# Patient Record
Sex: Male | Born: 1975 | Hispanic: Yes | Marital: Single | Smoking: Never smoker
Health system: Southern US, Community
[De-identification: ages and names within clinical notes are randomized; demographics above are authoritative.]

## PROBLEM LIST (undated history)

## (undated) HISTORY — PX: FACIAL FRACTURE SURGERY: SHX1570

---

## 2014-02-20 ENCOUNTER — Emergency Department: Payer: Self-pay | Admitting: Emergency Medicine

## 2020-12-07 ENCOUNTER — Emergency Department
Admission: EM | Admit: 2020-12-07 | Discharge: 2020-12-07 | Disposition: A | Discharge status: Home / Self Care | Payer: HRSA Program | Attending: Emergency Medicine | Admitting: Emergency Medicine

## 2020-12-07 ENCOUNTER — Other Ambulatory Visit: Payer: Self-pay

## 2020-12-07 DIAGNOSIS — U071 COVID-19: Secondary | ICD-10-CM | POA: Insufficient documentation

## 2020-12-07 DIAGNOSIS — M545 Low back pain, unspecified: Secondary | ICD-10-CM | POA: Diagnosis not present

## 2020-12-07 DIAGNOSIS — R197 Diarrhea, unspecified: Secondary | ICD-10-CM | POA: Diagnosis present

## 2020-12-07 DIAGNOSIS — E860 Dehydration: Secondary | ICD-10-CM | POA: Insufficient documentation

## 2020-12-07 DIAGNOSIS — A084 Viral intestinal infection, unspecified: Secondary | ICD-10-CM | POA: Diagnosis not present

## 2020-12-07 LAB — COMPREHENSIVE METABOLIC PANEL
ALT: 37 U/L (ref 0–44)
AST: 49 U/L — ABNORMAL HIGH (ref 15–41)
Albumin: 3 g/dL — ABNORMAL LOW (ref 3.5–5.0)
Alkaline Phosphatase: 81 U/L (ref 38–126)
Anion gap: 15 (ref 5–15)
BUN: 20 mg/dL (ref 6–20)
CO2: 21 mmol/L — ABNORMAL LOW (ref 22–32)
Calcium: 8.6 mg/dL — ABNORMAL LOW (ref 8.9–10.3)
Chloride: 101 mmol/L (ref 98–111)
Creatinine, Ser: 1.85 mg/dL — ABNORMAL HIGH (ref 0.61–1.24)
GFR, Estimated: 46 mL/min — ABNORMAL LOW (ref >60)
Glucose, Bld: 130 mg/dL — ABNORMAL HIGH (ref 70–99)
Potassium: 3.5 mmol/L (ref 3.5–5.1)
Sodium: 137 mmol/L (ref 135–145)
Total Bilirubin: 1.7 mg/dL — ABNORMAL HIGH (ref 0.3–1.2)
Total Protein: 7.6 g/dL (ref 6.5–8.1)

## 2020-12-07 LAB — CBC
HCT: 52.4 % — ABNORMAL HIGH (ref 39.0–52.0)
Hemoglobin: 18.6 g/dL — ABNORMAL HIGH (ref 13.0–17.0)
MCH: 31.6 pg (ref 26.0–34.0)
MCHC: 35.5 g/dL (ref 30.0–36.0)
MCV: 89 fL (ref 80.0–100.0)
Platelets: 260 10*3/uL (ref 150–400)
RBC: 5.89 MIL/uL — ABNORMAL HIGH (ref 4.22–5.81)
RDW: 12.6 % (ref 11.5–15.5)
WBC: 9.2 10*3/uL (ref 4.0–10.5)
nRBC: 0 % (ref 0.0–0.2)

## 2020-12-07 LAB — URINALYSIS, COMPLETE (UACMP) WITH MICROSCOPIC
Bilirubin Urine: NEGATIVE
Glucose, UA: NEGATIVE mg/dL
Ketones, ur: 20 mg/dL — AB
Leukocytes,Ua: NEGATIVE
Nitrite: NEGATIVE
Protein, ur: >=300 mg/dL — AB
Specific Gravity, Urine: 1.027 (ref 1.005–1.030)
pH: 5 (ref 5.0–8.0)

## 2020-12-07 LAB — POC SARS CORONAVIRUS 2 AG -  ED: SARS Coronavirus 2 Ag: NEGATIVE

## 2020-12-07 LAB — LACTIC ACID, PLASMA: Lactic Acid, Venous: 1.8 mmol/L (ref 0.5–1.9)

## 2020-12-07 LAB — LIPASE, BLOOD: Lipase: 41 U/L (ref 11–51)

## 2020-12-07 MED ORDER — PANTOPRAZOLE SODIUM 40 MG IV SOLR
40.0000 mg | Freq: Once | INTRAVENOUS | Status: AC
  Administered 2020-12-07: 40 mg via INTRAVENOUS
  Filled 2020-12-07: qty 40

## 2020-12-07 MED ORDER — OXYCODONE-ACETAMINOPHEN 5-325 MG PO TABS: 1.0000 | ORAL_TABLET | Freq: Once | ORAL | Status: DC

## 2020-12-07 MED ORDER — DEXTROSE 5 % AND 0.9 % NACL IV BOLUS
1000.0000 mL | Freq: Once | INTRAVENOUS | Status: DC
  Filled 2020-12-07: qty 1000

## 2020-12-07 MED ORDER — ONDANSETRON 4 MG PO TBDP: 4.0000 mg | ORAL_TABLET | Freq: Once | ORAL | Status: DC | PRN

## 2020-12-07 MED ORDER — FENTANYL CITRATE (PF) 100 MCG/2ML IJ SOLN
50.0000 ug | INTRAMUSCULAR | Status: AC | PRN
  Administered 2020-12-07 (×2): 50 ug via INTRAVENOUS
  Filled 2020-12-07 (×2): qty 2

## 2020-12-07 MED ORDER — ONDANSETRON HCL 4 MG/2ML IJ SOLN
4.0000 mg | Freq: Once | INTRAMUSCULAR | Status: AC
  Administered 2020-12-07: 4 mg via INTRAVENOUS
  Filled 2020-12-07: qty 2

## 2020-12-07 MED ORDER — FAMOTIDINE 20 MG PO TABS: 20.0000 mg | ORAL_TABLET | Freq: Two times a day (BID) | ORAL | 0 refills | Status: AC

## 2020-12-07 MED ORDER — DEXTROSE 5 % IN LACTATED RINGERS IV BOLUS
1000.0000 mL | Freq: Once | INTRAVENOUS | Status: AC
  Administered 2020-12-07: 1000 mL via INTRAVENOUS
  Filled 2020-12-07: qty 1000

## 2020-12-07 MED ORDER — ONDANSETRON 4 MG PO TBDP: 4.0000 mg | ORAL_TABLET | Freq: Three times a day (TID) | ORAL | 0 refills | Status: AC | PRN

## 2020-12-07 MED ORDER — NAPROXEN 500 MG PO TABS: 500.0000 mg | ORAL_TABLET | Freq: Two times a day (BID) | ORAL | 0 refills | Status: DC

## 2020-12-07 NOTE — ED Notes (Signed)
Provided with drink and crackers

## 2020-12-07 NOTE — ED Triage Notes (Signed)
Pt c/o severe lower back pain with N/V/D for the past week. Denies fever,

## 2020-12-07 NOTE — ED Provider Notes (Signed)
[facility] Emergency Department Provider Note  ____________________________________________  Time seen: Approximately 3:45 PM  I have reviewed the triage vital signs and the nursing notes.   HISTORY  Chief Complaint Emesis, Back Pain, and Diarrhea    HPI Luis Dyer is a 45 y.o. male with no significant past medical history who comes ED complaining of nausea vomiting diarrhea for the past week associated with gradual onset of bilateral low back pain which hurts with movement, nonradiating.  No alleviating factors.  Moderate intensity.  Feels achy.  No black or bloody stool, no bloody vomit.  No dizziness or syncope.  No body aches or fever.      History reviewed. No pertinent past medical history.   There are no problems to display for this patient.    Past Surgical History:  Procedure Laterality Date  . FACIAL FRACTURE SURGERY       Prior to Admission medications   Medication Sig Start Date End Date Taking? Authorizing Provider  famotidine (PEPCID) 20 MG tablet Take 1 tablet (20 mg total) by mouth 2 (two) times daily. 12/07/20  Yes Sharman Cheek, MD  naproxen (NAPROSYN) 500 MG tablet Take 1 tablet (500 mg total) by mouth 2 (two) times daily with a meal. 12/07/20  Yes Sharman Cheek, MD  ondansetron (ZOFRAN ODT) 4 MG disintegrating tablet Take 1 tablet (4 mg total) by mouth every 8 (eight) hours as needed for nausea or vomiting. 12/07/20  Yes Sharman Cheek, MD     Allergies Patient has no known allergies.   No family history on file.  Social History Social History   Tobacco Use  . Smoking status: Never Smoker  . Smokeless tobacco: Never Used  Substance Use Topics  . Alcohol use: Not Currently  . Drug use: Not Currently    Review of Systems  Constitutional:   No fever or chills.  ENT:   No sore throat. No rhinorrhea. Cardiovascular:   No chest pain or syncope. Respiratory:   No dyspnea or cough. Gastrointestinal:    Negative for abdominal pain, positive vomiting and diarrhea.  Musculoskeletal:   Positive low back pain All other systems reviewed and are negative except as documented above in ROS and HPI.  ____________________________________________   PHYSICAL EXAM:  VITAL SIGNS: ED Triage Vitals  Enc Vitals Group     BP 12/07/20 0945 109/75     Pulse Rate 12/07/20 0945 (!) 144     Resp 12/07/20 0945 18     Temp 12/07/20 0945 98.1 F (36.7 C)     Temp src --      SpO2 12/07/20 0945 95 %     Weight 12/07/20 0950 188 lb (85.3 kg)     Height 12/07/20 0950 6' (1.829 m)     Head Circumference --      Peak Flow --      Pain Score 12/07/20 0950 10     Pain Loc --      Pain Edu? --      Excl. in GC? --     Vital signs reviewed, nursing assessments reviewed.   Constitutional:   Alert and oriented. Non-toxic appearance. Eyes:   Conjunctivae are normal. EOMI. PERRL. ENT      Head:   Normocephalic and atraumatic.      Nose:   Wearing a mask.      Mouth/Throat:   Wearing a mask.      Neck:   No meningismus. Full ROM. Hematological/Lymphatic/Immunilogical:  No cervical lymphadenopathy. Cardiovascular:   Tachycardia heart rate 120. Symmetric bilateral radial and DP pulses.  No murmurs. Cap refill less than 2 seconds. Respiratory:   Normal respiratory effort without tachypnea/retractions. Breath sounds are clear and equal bilaterally. No wheezes/rales/rhonchi. Gastrointestinal:   Soft and nontender. Non distended. There is no CVA tenderness.  No rebound, rigidity, or guarding.  Musculoskeletal:   Normal range of motion in all extremities. No joint effusions.  No lower extremity tenderness.  No edema.  No midline spinal tenderness.  There is mild muscular tenderness in the lower back bilaterally Neurologic:   Normal speech and language.  Motor grossly intact. No acute focal neurologic deficits are appreciated.  Skin:    Skin is warm, dry and intact. No rash noted.  No petechiae, purpura, or  bullae.  ____________________________________________    LABS (pertinent positives/negatives) (all labs ordered are listed, but only abnormal results are displayed) Labs Reviewed  COMPREHENSIVE METABOLIC PANEL - Abnormal; Notable for the following components:      Result Value   CO2 21 (*)    Glucose, Bld 130 (*)    Creatinine, Ser 1.85 (*)    Calcium 8.6 (*)    Albumin 3.0 (*)    AST 49 (*)    Total Bilirubin 1.7 (*)    GFR, Estimated 46 (*)    All other components within normal limits  CBC - Abnormal; Notable for the following components:   RBC 5.89 (*)    Hemoglobin 18.6 (*)    HCT 52.4 (*)    All other components within normal limits  URINALYSIS, COMPLETE (UACMP) WITH MICROSCOPIC - Abnormal; Notable for the following components:   Color, Urine AMBER (*)    APPearance CLEAR (*)    Hgb urine dipstick SMALL (*)    Ketones, ur 20 (*)    Protein, ur >=300 (*)    Bacteria, UA RARE (*)    All other components within normal limits  SARS CORONAVIRUS 2 (TAT 6-24 HRS)  LIPASE, BLOOD  LACTIC ACID, PLASMA  POC SARS CORONAVIRUS 2 AG -  ED   ____________________________________________   EKG  Interpreted by me Sinus tachycardia rate 142.  Normal axis and intervals.  Normal QRS ST segments and T waves.  No ischemic changes  ____________________________________________    RADIOLOGY  No results found.  ____________________________________________   PROCEDURES Procedures  ____________________________________________  DIFFERENTIAL DIAGNOSIS   COVID, influenza, viral gastroenteritis, dehydration, electrolyte abnormality  CLINICAL IMPRESSION / ASSESSMENT AND PLAN / ED COURSE  Medications ordered in the ED: Medications  fentaNYL (SUBLIMAZE) injection 50 mcg (50 mcg Intravenous Given 12/07/20 1429)  ondansetron (ZOFRAN) injection 4 mg (4 mg Intravenous Given 12/07/20 1013)  pantoprazole (PROTONIX) injection 40 mg (40 mg Intravenous Given 12/07/20 1309)  dextrose 5%  lactated ringers bolus 1,000 mL (0 mLs Intravenous Stopped 12/07/20 1423)    Pertinent labs & imaging results that were available during my care of the patient were reviewed by me and considered in my medical decision making (see chart for details).  Javonni A Dennis Bast was evaluated in Emergency Department on 12/07/2020 for the symptoms described in the history of present illness. He was evaluated in the context of the global COVID-19 pandemic, which necessitated consideration that the patient might be at risk for infection with the SARS-CoV-2 virus that causes COVID-19. Institutional protocols and algorithms that pertain to the evaluation of patients at risk for COVID-19 are in a state of rapid change based on information released by regulatory bodies  including the CDC and federal and state organizations. These policies and algorithms were followed during the patient's care in the ED.   Patient presents with nausea vomiting diarrhea, evidence of dehydration.  Back pain appears to be musculoskeletal.  Exam is benign and reassuring other than tachycardia which I think is due to dehydration.    Considering the patient's symptoms, medical history, and physical examination today, I have low suspicion for cholecystitis or biliary pathology, pancreatitis, perforation or bowel obstruction, hernia, intra-abdominal abscess, AAA or dissection, volvulus or intussusception, mesenteric ischemia, or appendicitis.  Labs show hemoconcentration, some ketones in the urine consistent with dehydration.  Patient given IV fluids, Zofran and Protonix and feels much better.  States his symptoms have resolved, he is tolerating p.o. and stable for discharge home.  We will continue to treat supportively with Zofran and Pepcid.      ____________________________________________   FINAL CLINICAL IMPRESSION(S) / ED DIAGNOSES    Final diagnoses:  Viral gastroenteritis  Dehydration     ED Discharge Orders         Ordered     famotidine (PEPCID) 20 MG tablet  2 times daily        12/07/20 1544    ondansetron (ZOFRAN ODT) 4 MG disintegrating tablet  Every 8 hours PRN        12/07/20 1544    naproxen (NAPROSYN) 500 MG tablet  2 times daily with meals        12/07/20 1544          Portions of this note were generated with dragon dictation software. Dictation errors may occur despite best attempts at proofreading.   Sharman Cheek, MD 12/07/20 [phone]

## 2020-12-08 LAB — SARS CORONAVIRUS 2 (TAT 6-24 HRS): SARS Coronavirus 2: POSITIVE — AB

## 2020-12-13 ENCOUNTER — Other Ambulatory Visit: Payer: Self-pay

## 2020-12-13 ENCOUNTER — Emergency Department: Payer: HRSA Program

## 2020-12-13 ENCOUNTER — Inpatient Hospital Stay
Admission: EM | Admit: 2020-12-13 | Discharge: 2020-12-27 | DRG: 177 | Disposition: A | Discharge status: Home Health | Payer: HRSA Program | Attending: Internal Medicine | Admitting: Internal Medicine

## 2020-12-13 DIAGNOSIS — R7301 Impaired fasting glucose: Secondary | ICD-10-CM | POA: Diagnosis not present

## 2020-12-13 DIAGNOSIS — N179 Acute kidney failure, unspecified: Secondary | ICD-10-CM | POA: Diagnosis present

## 2020-12-13 DIAGNOSIS — I2609 Other pulmonary embolism with acute cor pulmonale: Secondary | ICD-10-CM | POA: Diagnosis not present

## 2020-12-13 DIAGNOSIS — T380X5A Adverse effect of glucocorticoids and synthetic analogues, initial encounter: Secondary | ICD-10-CM | POA: Diagnosis not present

## 2020-12-13 DIAGNOSIS — U071 COVID-19: Principal | ICD-10-CM | POA: Diagnosis present

## 2020-12-13 DIAGNOSIS — J1282 Pneumonia due to coronavirus disease 2019: Secondary | ICD-10-CM | POA: Diagnosis present

## 2020-12-13 DIAGNOSIS — Z79899 Other long term (current) drug therapy: Secondary | ICD-10-CM | POA: Diagnosis not present

## 2020-12-13 DIAGNOSIS — I2694 Multiple subsegmental pulmonary emboli without acute cor pulmonale: Secondary | ICD-10-CM

## 2020-12-13 DIAGNOSIS — I2699 Other pulmonary embolism without acute cor pulmonale: Secondary | ICD-10-CM

## 2020-12-13 DIAGNOSIS — N182 Chronic kidney disease, stage 2 (mild): Secondary | ICD-10-CM | POA: Diagnosis present

## 2020-12-13 DIAGNOSIS — J9601 Acute respiratory failure with hypoxia: Secondary | ICD-10-CM | POA: Diagnosis present

## 2020-12-13 DIAGNOSIS — R7989 Other specified abnormal findings of blood chemistry: Secondary | ICD-10-CM

## 2020-12-13 DIAGNOSIS — R531 Weakness: Secondary | ICD-10-CM

## 2020-12-13 DIAGNOSIS — N189 Chronic kidney disease, unspecified: Secondary | ICD-10-CM

## 2020-12-13 LAB — CBC
HCT: 43.7 % (ref 39.0–52.0)
Hemoglobin: 15.1 g/dL (ref 13.0–17.0)
MCH: 31.1 pg (ref 26.0–34.0)
MCHC: 34.6 g/dL (ref 30.0–36.0)
MCV: 89.9 fL (ref 80.0–100.0)
Platelets: 350 10*3/uL (ref 150–400)
RBC: 4.86 MIL/uL (ref 4.22–5.81)
RDW: 13.7 % (ref 11.5–15.5)
WBC: 10.1 10*3/uL (ref 4.0–10.5)
nRBC: 0 % (ref 0.0–0.2)

## 2020-12-13 LAB — BASIC METABOLIC PANEL
Anion gap: 13 (ref 5–15)
BUN: 20 mg/dL (ref 6–20)
CO2: 22 mmol/L (ref 22–32)
Calcium: 8.1 mg/dL — ABNORMAL LOW (ref 8.9–10.3)
Chloride: 103 mmol/L (ref 98–111)
Creatinine, Ser: 1.85 mg/dL — ABNORMAL HIGH (ref 0.61–1.24)
GFR, Estimated: 46 mL/min — ABNORMAL LOW (ref >60)
Glucose, Bld: 107 mg/dL — ABNORMAL HIGH (ref 70–99)
Potassium: 3.6 mmol/L (ref 3.5–5.1)
Sodium: 138 mmol/L (ref 135–145)

## 2020-12-13 LAB — TROPONIN I (HIGH SENSITIVITY)
Troponin I (High Sensitivity): 6 ng/L (ref <18)
Troponin I (High Sensitivity): 6 ng/L (ref <18)

## 2020-12-13 MED ORDER — SODIUM CHLORIDE 0.9 % IV SOLN
100.0000 mg | Freq: Every day | INTRAVENOUS | Status: AC
  Administered 2020-12-14 – 2020-12-17 (×4): 100 mg via INTRAVENOUS
  Filled 2020-12-13 (×3): qty 20
  Filled 2020-12-13: qty 100

## 2020-12-13 MED ORDER — LACTATED RINGERS IV BOLUS
1000.0000 mL | Freq: Once | INTRAVENOUS | Status: AC
  Administered 2020-12-13: 1000 mL via INTRAVENOUS

## 2020-12-13 MED ORDER — DEXAMETHASONE SODIUM PHOSPHATE 10 MG/ML IJ SOLN
10.0000 mg | Freq: Once | INTRAMUSCULAR | Status: AC
  Administered 2020-12-13: 10 mg via INTRAVENOUS
  Filled 2020-12-13: qty 1

## 2020-12-13 MED ORDER — SODIUM CHLORIDE 0.9 % IV SOLN
200.0000 mg | Freq: Once | INTRAVENOUS | Status: AC
  Administered 2020-12-13: 200 mg via INTRAVENOUS
  Filled 2020-12-13: qty 200

## 2020-12-13 NOTE — H&P (Signed)
History and Physical        Hospital Admission Note Date: 12/13/2020  Patient name: Luis Dyer Medical record number: [id number] Date of birth: 11-04-1976 Age: 45 y.o. Gender: male  PCP: Patient, Dyer Pcp Per    Patient coming from: Home   I have reviewed all records in the [facility].    Chief Complaint:  SOB   HPI: Luis Dyer is a 45 y.o. male with Dyer significant PMH who presents to the ER with SOB and associated chest pain for the past 2-3 days. Reports he was seen in the ED a week ago. Treated for dehydration and gastroenteritis. Apparently COVID test at that time was positive but patient reports he was not informed.   Reports aching chest pain present with coughing. Not at rest or with inspiration. SOB is fairly constant and worse with exertion. Endorses chills/rigor. Cannot recall if he was vaccinated for COVID.    ED work-up/course:   Patient presents with shortness of breath and hypoxia, tachycardia in the setting of recent COVID-19 diagnosis.  Chest x-ray viewed and interpreted by me, shows multifocal infiltrates consistent with COVID-pneumonia.  Radiology report agrees.  Doubt PE, ACS, dissection.  Doubt bacterial infection or sepsis.  Will give IV fluids for hydration, Decadron and remdesivir for hypoxic respiratory failure with COVID-pneumonia, plan to admit.  Findings and plan of care discussed with the patient's Sister by phone at his request.  Review of Systems: Positives marked in 'bold' Constitutional: Denies fever, chills, diaphoresis, poor appetite and fatigue.  HEENT: Denies photophobia, eye pain, redness, hearing loss, ear pain, congestion, sore throat, rhinorrhea, sneezing, mouth sores, trouble swallowing, neck pain, neck stiffness and tinnitus.   Respiratory: Denies SOB, DOE, cough, chest tightness,  and wheezing.   Cardiovascular: Denies chest pain, palpitations and leg  swelling.  Gastrointestinal: Denies nausea, vomiting, abdominal pain, diarrhea, constipation, blood in stool and abdominal distention.  Genitourinary: Denies dysuria, urgency, frequency, hematuria, flank pain and difficulty urinating.  Musculoskeletal: Denies myalgias, back pain, joint swelling, arthralgias and gait problem.  Skin: Denies pallor, rash and wound.  Neurological: Denies dizziness, seizures, syncope, weakness, light-headedness, numbness and headaches.  Hematological: Denies adenopathy. Easy bruising, personal or family bleeding history  Psychiatric/Behavioral: Denies suicidal ideation, mood changes, confusion, nervousness, sleep disturbance and agitation  Past Medical History: History reviewed. Dyer pertinent past medical history.  Past Surgical History:  Procedure Laterality Date  . FACIAL FRACTURE SURGERY      Medications: Prior to Admission medications   Medication Sig Start Date End Date Taking? Authorizing Provider  famotidine (PEPCID) 20 MG tablet Take 1 tablet (20 mg total) by mouth 2 (two) times daily. 12/07/20   Sharman Cheek, MD  naproxen (NAPROSYN) 500 MG tablet Take 1 tablet (500 mg total) by mouth 2 (two) times daily with a meal. 12/07/20   Sharman Cheek, MD  ondansetron (ZOFRAN ODT) 4 MG disintegrating tablet Take 1 tablet (4 mg total) by mouth every 8 (eight) hours as needed for nausea or vomiting. 12/07/20   Sharman Cheek, MD    Allergies:  Dyer Known Allergies  Social History:  reports that he has never smoked. He has never  used smokeless tobacco. He reports previous alcohol use. He reports previous drug use.  Family History: Dyer family history on file.  Physical Exam: Blood pressure 109/80, pulse 97, temperature 99.9 F (37.7 C), temperature source Oral, resp. rate (!) 41, height 6' (1.829 m), weight 85.3 kg, SpO2 95 %. General: Alert, awake, oriented x3, non-toxic  Eyes: pink conjunctiva,anicteric sclera, pupils equal and reactive to light and  accomodation, HEENT: normocephalic, atraumatic, oropharynx clear Neck: supple, Dyer masses or lymphadenopathy, Dyer goiter, Dyer bruits, Dyer JVD CVS: Tachycardic rate and rhythm, without murmurs, rubs or gallops. Dyer lower extremity edema Resp : [place] in place. Tachypnea. Increased WOB. Unable to speak in full sentences. Crackles present in all lung fields.  GI : Soft, nontender, nondistended, positive bowel sounds, Dyer masses. Dyer hepatomegaly. Dyer hernia.  Musculoskeletal: Dyer clubbing or cyanosis, positive pedal pulses. Dyer contracture. ROM intact  Neuro: Grossly intact, Dyer focal neurological deficits, strength 5/5 upper and lower extremities bilaterally Psych: alert and oriented x 3, normal mood and affect Skin: Dyer rashes or lesions, warm and dry   LABS on Admission: I have personally reviewed all the labs and imagings below    Basic Metabolic Panel: Recent Labs  Lab 12/07/20 1003 12/13/20 1427  NA 137 138  K 3.5 3.6  CL 101 103  CO2 21* 22  GLUCOSE 130* 107*  BUN 20 20  CREATININE 1.85* 1.85*  CALCIUM 8.6* 8.1*   Liver Function Tests: Recent Labs  Lab 12/07/20 1003  AST 49*  ALT 37  ALKPHOS 81  BILITOT 1.7*  PROT 7.6  ALBUMIN 3.0*   Recent Labs  Lab 12/07/20 1003  LIPASE 41   Dyer results for input(s): AMMONIA in the last 168 hours. CBC: Recent Labs  Lab 12/07/20 1003 12/13/20 1427  WBC 9.2 10.1  HGB 18.6* 15.1  HCT 52.4* 43.7  MCV 89.0 89.9  PLT 260 350   Cardiac Enzymes: Dyer results for input(s): CKTOTAL, CKMB, CKMBINDEX, TROPONINI in the last 168 hours. BNP: Invalid input(s): POCBNP CBG: Dyer results for input(s): GLUCAP in the last 168 hours.  Radiological Exams on Admission:  DG Chest 2 View  Result Date: 12/13/2020 CLINICAL DATA:  Left chest pain and shortness of breath. COVID-19 positive patient. EXAM: CHEST - 2 VIEW COMPARISON:  None. FINDINGS: There is extensive bilateral airspace disease consistent with pneumonia. Dyer pneumothorax or pleural effusion. Heart  size is normal. IMPRESSION: Extensive multifocal pneumonia has an appearance most compatible with COVID-19 infection. Electronically Signed   By: Drusilla Kanner M.D.   On: 12/13/2020 15:07      EKG: Independently reviewed. Sinus tachycardia.    Assessment/Plan Active Problems:   COVID-19 virus infection   Pneumonia due to COVID-19 virus   Elevated serum creatinine  COVID-19 Pneumonia  Patient presenting with SOB found to be hypoxic to 87% on RA. Tachycardic. Tachypnea. COVID-19 positive from 1/7. CXR shows multifocal PNA. Stable O2 on 4L [place]. Decadron and Remdesivir started in ED. IVF given.  -admit to progressive care with telemetry and continuous pulse ox  -continue supplemental O2  -monitor labs: phosphorous, Mg, ferritin, D-dimer, CRP, CBC, CMET -continue Decadron -continue Remdesivir  -antitussives  -ipratropium q6h  -airborne and contact precautions   Chest Pain:  Suspect related to SOB and cough with COVID-19 PNA as is intermittent and only associated with coughing. EKG without acute ST segment changes. Troponin neg x2. CXR with PNA but Dyer other acute findings. Doubt ACS. Dyer signs of VTE.   Elevated Serum Cr  Cr 1.85. Consistent with lab obtained 1/7. Dyer prior comparison. Unclear whether AKI related to acute illness versus undiagnosed CKD.  -monitor   DVT prophylaxis: Lovenox   CODE STATUS: FULL   Consults called: None   Family Communication: Admission, patients condition and plan of care including tests being ordered have been discussed with the patient and patient's sister & brother-in-law via phone who indicates understanding and agree with the plan and Code Status  Admission status:  Inpatient   The medical decision making on this patient was of high complexity and the patient is at high risk for clinical deterioration, therefore this is a level 3 admission.  Severity of Illness:     Moderate  The appropriate patient status for this patient is INPATIENT.  Inpatient status is judged to be reasonable and necessary in order to provide the required intensity of service to ensure the patient's safety. The patient's presenting symptoms, physical exam findings, and initial radiographic and laboratory data in the context of their chronic comorbidities is felt to place them at high risk for further clinical deterioration. Furthermore, it is not anticipated that the patient will be medically stable for discharge from the hospital within 2 midnights of admission. The following factors support the patient status of inpatient.   " The patient's presenting symptoms include SOB, cough, chest pain. " The worrisome physical exam findings include hypoxia, tachycardia, tachypnea, crackles in lungs, SOB when speaking in full sentences. " The initial radiographic and laboratory data are worrisome because of multifocal PNA on CXR, COVID-19+. " The chronic co-morbidities include none .   * I certify that at the point of admission it is my clinical judgment that the patient will require inpatient hospital care spanning beyond 2 midnights from the point of admission due to high intensity of service, high risk for further deterioration and high frequency of surveillance required.*    Time Spent on Admission: 48 minutes      De Hollingshead D.O.  [facility] 12/13/2020, 8:38 PM

## 2020-12-13 NOTE — ED Provider Notes (Signed)
[facility] Emergency Department Provider Note  ____________________________________________  Time seen: Approximately 5:52 PM  I have reviewed the triage vital signs and the nursing notes.   HISTORY  Chief Complaint Chest Pain and Shortness of Breath    HPI Luis Dyer is a 45 y.o. male with no significant past medical history  who comes the ED complaining of left-sided chest pain and shortness of breath that were gradual onset and worsening over the past 3 to 4 days.  He was seen in the ED by myself a week ago for gastroenteritis and dehydration.  He was treated with supportive care and hydration, felt much better his symptoms essentially resolved and he was able to be discharged home.  Follow-up PCR test obtained at that time was positive for COVID, and since then he has had progressive symptoms.  Chest pain is aching, moderate intensity, intermittent, only present with coughing, not present with breathing.  Nonradiating.  Shortness of breath is constant, moderate intensity, worse with walking, no alleviating factors.  No history of DVT or PE.  Found to have room air oxygen saturation of 87%, requiring 4 L nasal cannula.   History reviewed. No pertinent past medical history.   There are no problems to display for this patient.    Past Surgical History:  Procedure Laterality Date  . FACIAL FRACTURE SURGERY       Prior to Admission medications   Medication Sig Start Date End Date Taking? Authorizing Provider  famotidine (PEPCID) 20 MG tablet Take 1 tablet (20 mg total) by mouth 2 (two) times daily. 12/07/20   Sharman Cheek, MD  naproxen (NAPROSYN) 500 MG tablet Take 1 tablet (500 mg total) by mouth 2 (two) times daily with a meal. 12/07/20   Sharman Cheek, MD  ondansetron (ZOFRAN ODT) 4 MG disintegrating tablet Take 1 tablet (4 mg total) by mouth every 8 (eight) hours as needed for nausea or vomiting. 12/07/20   Sharman Cheek, MD      Allergies Patient has no known allergies.   No family history on file.  Social History Social History   Tobacco Use  . Smoking status: Never Smoker  . Smokeless tobacco: Never Used  Substance Use Topics  . Alcohol use: Not Currently  . Drug use: Not Currently    Review of Systems  Constitutional:   No fever positive chills.  ENT:   No sore throat. No rhinorrhea. Cardiovascular:   Positive chest discomfort with cough, no syncope. Respiratory: Positive shortness of breath and nonproductive cough. Gastrointestinal:   Negative for abdominal pain, positive vomiting and diarrhea.  Musculoskeletal:   Negative for focal pain or swelling All other systems reviewed and are negative except as documented above in ROS and HPI.  ____________________________________________   PHYSICAL EXAM:  VITAL SIGNS: ED Triage Vitals  Enc Vitals Group     BP 12/13/20 1418 110/79     Pulse Rate 12/13/20 1418 (!) 124     Resp 12/13/20 1418 16     Temp 12/13/20 1418 98.6 F (37 C)     Temp Source 12/13/20 1418 Oral     SpO2 12/13/20 1418 95 %     Weight 12/13/20 1423 188 lb (85.3 kg)     Height 12/13/20 1423 6' (1.829 m)     Head Circumference --      Peak Flow --      Pain Score 12/13/20 1423 8     Pain Loc --  Pain Edu? --      Excl. in GC? --     Vital signs reviewed, nursing assessments reviewed.   Constitutional:   Alert and oriented. Non-toxic appearance. Eyes:   Conjunctivae are normal. EOMI. PERRL. ENT      Head:   Normocephalic and atraumatic.      Nose:   Wearing a mask.      Mouth/Throat:   Wearing a mask.      Neck:   No meningismus. Full ROM. Hematological/Lymphatic/Immunilogical:   No cervical lymphadenopathy. Cardiovascular:   Tachycardia heart rate 130. Symmetric bilateral radial and DP pulses.  No murmurs. Cap refill less than 2 seconds. Respiratory:   Tachypnea, no accessory muscle use.  Diffuse inspiratory crackles.  No wheezing.  Symmetric air  movement.. Gastrointestinal:   Soft and nontender. Non distended.   No rebound, rigidity, or guarding Musculoskeletal:   Normal range of motion in all extremities. No joint effusions.  No lower extremity tenderness.  No edema. Neurologic:   Normal speech and language.  Motor grossly intact. No acute focal neurologic deficits are appreciated.  Skin:    Skin is warm, dry and intact. No rash noted.  No petechiae, purpura, or bullae.  ____________________________________________    LABS (pertinent positives/negatives) (all labs ordered are listed, but only abnormal results are displayed) Labs Reviewed  BASIC METABOLIC PANEL - Abnormal; Notable for the following components:      Result Value   Glucose, Bld 107 (*)    Creatinine, Ser 1.85 (*)    Calcium 8.1 (*)    GFR, Estimated 46 (*)    All other components within normal limits  CBC  TROPONIN I (HIGH SENSITIVITY)  TROPONIN I (HIGH SENSITIVITY)   ____________________________________________   EKG  Interpreted by me Sinus tachycardia rate 125.  Normal axis and intervals.  Normal QRS ST segments and T waves.  No evidence of right heart strain.  ____________________________________________    RADIOLOGY  DG Chest 2 View  Result Date: 12/13/2020 CLINICAL DATA:  Left chest pain and shortness of breath. COVID-19 positive patient. EXAM: CHEST - 2 VIEW COMPARISON:  None. FINDINGS: There is extensive bilateral airspace disease consistent with pneumonia. No pneumothorax or pleural effusion. Heart size is normal. IMPRESSION: Extensive multifocal pneumonia has an appearance most compatible with COVID-19 infection. Electronically Signed   By: Drusilla Kanner M.D.   On: 12/13/2020 15:07    ____________________________________________   PROCEDURES .Critical Care Performed by: Sharman Cheek, MD Authorized by: Sharman Cheek, MD   Critical care provider statement:    Critical care time (minutes):  35   Critical care time was  exclusive of:  Separately billable procedures and treating other patients   Critical care was necessary to treat or prevent imminent or life-threatening deterioration of the following conditions:  Respiratory failure   Critical care was time spent personally by me on the following activities:  Development of treatment plan with patient or surrogate, discussions with consultants, evaluation of patient's response to treatment, examination of patient, obtaining history from patient or surrogate, ordering and performing treatments and interventions, ordering and review of laboratory studies, ordering and review of radiographic studies, pulse oximetry, re-evaluation of patient's condition and review of old charts Comments:        .1-3 Lead EKG Interpretation Performed by: Sharman Cheek, MD Authorized by: Sharman Cheek, MD     Interpretation: abnormal     ECG rate:  130   ECG rate assessment: tachycardic     Rhythm: sinus rhythm  Ectopy: none     Conduction: normal      ____________________________________________  DIFFERENTIAL DIAGNOSIS   COVID-19 pneumonitis, acidosis, pleural effusion, dehydration  CLINICAL IMPRESSION / ASSESSMENT AND PLAN / ED COURSE  Medications ordered in the ED: Medications  lactated ringers bolus 1,000 mL (has no administration in time range)  dexamethasone (DECADRON) injection 10 mg (10 mg Intravenous Given 12/13/20 1759)    Pertinent labs & imaging results that were available during my care of the patient were reviewed by me and considered in my medical decision making (see chart for details).  Luis Dyer was evaluated in Emergency Department on 12/13/2020 for the symptoms described in the history of present illness. He was evaluated in the context of the global COVID-19 pandemic, which necessitated consideration that the patient might be at risk for infection with the SARS-CoV-2 virus that causes COVID-19. Institutional protocols and  algorithms that pertain to the evaluation of patients at risk for COVID-19 are in a state of rapid change based on information released by regulatory bodies including the CDC and federal and state organizations. These policies and algorithms were followed during the patient's care in the ED.   Patient presents with shortness of breath and hypoxia, tachycardia in the setting of recent COVID-19 diagnosis.  Chest x-ray viewed and interpreted by me, shows multifocal infiltrates consistent with COVID-pneumonia.  Radiology report agrees.  Doubt PE, ACS, dissection.  Doubt bacterial infection or sepsis.  Will give IV fluids for hydration, Decadron and remdesivir for hypoxic respiratory failure with COVID-pneumonia, plan to admit.  Findings and plan of care discussed with the patient's Sister by phone at his request.      ____________________________________________   FINAL CLINICAL IMPRESSION(S) / ED DIAGNOSES    Final diagnoses:  Pneumonia due to COVID-19 virus  Acute respiratory failure with hypoxia [facility])     ED Discharge Orders    None      Portions of this note were generated with dragon dictation software. Dictation errors may occur despite best attempts at proofreading.   Sharman Cheek, MD 12/13/20 [phone]

## 2020-12-13 NOTE — ED Notes (Signed)
Patient denies pain and is resting comfortably.  

## 2020-12-13 NOTE — Consult Note (Signed)
Remdesivir - Pharmacy Brief Note   O:  CXR: Extensive multifocal pneumonia has an appearance most compatible with COVID-19 infection.  SpO2: 93% on 4 L [place]   A/P:  Remdesivir 200 mg IVPB once followed by 100 mg IVPB daily x 4 days.   Sharen Hones, PharmD, BCPS Clinical Pharmacist  12/13/2020 6:45 PM

## 2020-12-13 NOTE — ED Notes (Addendum)
Ed physician Scotty Court made aware pt placed on 4 L o2 due to decreased o2 saturation at 87% on room air.

## 2020-12-13 NOTE — ED Triage Notes (Signed)
Pt to ED from [facility] for chief complaint of COVID + 1 week, left sided cp and shob.  +Nausea.  Pt speaking in complete sentences Pt 90% on RA. Placed on 2L [place] 92% on 2L, increased to 3L

## 2020-12-14 ENCOUNTER — Encounter: Payer: Self-pay | Admitting: Internal Medicine

## 2020-12-14 ENCOUNTER — Inpatient Hospital Stay: Payer: HRSA Program

## 2020-12-14 DIAGNOSIS — J9601 Acute respiratory failure with hypoxia: Secondary | ICD-10-CM | POA: Insufficient documentation

## 2020-12-14 DIAGNOSIS — J1282 Pneumonia due to coronavirus disease 2019: Secondary | ICD-10-CM

## 2020-12-14 DIAGNOSIS — R7989 Other specified abnormal findings of blood chemistry: Secondary | ICD-10-CM

## 2020-12-14 LAB — COMPREHENSIVE METABOLIC PANEL
ALT: 56 U/L — ABNORMAL HIGH (ref 0–44)
AST: 45 U/L — ABNORMAL HIGH (ref 15–41)
Albumin: 2.1 g/dL — ABNORMAL LOW (ref 3.5–5.0)
Alkaline Phosphatase: 141 U/L — ABNORMAL HIGH (ref 38–126)
Anion gap: 13 (ref 5–15)
BUN: 29 mg/dL — ABNORMAL HIGH (ref 6–20)
CO2: 22 mmol/L (ref 22–32)
Calcium: 8.4 mg/dL — ABNORMAL LOW (ref 8.9–10.3)
Chloride: 106 mmol/L (ref 98–111)
Creatinine, Ser: 1.52 mg/dL — ABNORMAL HIGH (ref 0.61–1.24)
GFR, Estimated: 58 mL/min — ABNORMAL LOW (ref >60)
Glucose, Bld: 175 mg/dL — ABNORMAL HIGH (ref 70–99)
Potassium: 4.5 mmol/L (ref 3.5–5.1)
Sodium: 141 mmol/L (ref 135–145)
Total Bilirubin: 0.9 mg/dL (ref 0.3–1.2)
Total Protein: 6.9 g/dL (ref 6.5–8.1)

## 2020-12-14 LAB — CBC WITH DIFFERENTIAL/PLATELET
Abs Immature Granulocytes: 0.07 10*3/uL (ref 0.00–0.07)
Basophils Absolute: 0 10*3/uL (ref 0.0–0.1)
Basophils Relative: 0 %
Eosinophils Absolute: 0 10*3/uL (ref 0.0–0.5)
Eosinophils Relative: 0 %
HCT: 41.4 % (ref 39.0–52.0)
Hemoglobin: 14.2 g/dL (ref 13.0–17.0)
Immature Granulocytes: 1 %
Lymphocytes Relative: 17 %
Lymphs Abs: 1.1 10*3/uL (ref 0.7–4.0)
MCH: 30.9 pg (ref 26.0–34.0)
MCHC: 34.3 g/dL (ref 30.0–36.0)
MCV: 90 fL (ref 80.0–100.0)
Monocytes Absolute: 0.2 10*3/uL (ref 0.1–1.0)
Monocytes Relative: 3 %
Neutro Abs: 5.3 10*3/uL (ref 1.7–7.7)
Neutrophils Relative %: 79 %
Platelets: 359 10*3/uL (ref 150–400)
RBC: 4.6 MIL/uL (ref 4.22–5.81)
RDW: 14 % (ref 11.5–15.5)
WBC: 6.6 10*3/uL (ref 4.0–10.5)
nRBC: 0 % (ref 0.0–0.2)

## 2020-12-14 LAB — FIBRIN DERIVATIVES D-DIMER (ARMC ONLY): Fibrin derivatives D-dimer (ARMC): >7500 ng/mL (FEU) — ABNORMAL HIGH (ref 0.00–499.00)

## 2020-12-14 LAB — HIV ANTIBODY (ROUTINE TESTING W REFLEX): HIV Screen 4th Generation wRfx: NONREACTIVE

## 2020-12-14 LAB — HEMOGLOBIN A1C
Hgb A1c MFr Bld: 6 % — ABNORMAL HIGH (ref 4.8–5.6)
Mean Plasma Glucose: 125.5 mg/dL

## 2020-12-14 LAB — MAGNESIUM: Magnesium: 2.5 mg/dL — ABNORMAL HIGH (ref 1.7–2.4)

## 2020-12-14 LAB — PHOSPHORUS: Phosphorus: 5.4 mg/dL — ABNORMAL HIGH (ref 2.5–4.6)

## 2020-12-14 LAB — FERRITIN: Ferritin: 2087 ng/mL — ABNORMAL HIGH (ref 24–336)

## 2020-12-14 LAB — C-REACTIVE PROTEIN: CRP: 25.8 mg/dL — ABNORMAL HIGH (ref <1.0)

## 2020-12-14 MED ORDER — SODIUM CHLORIDE 0.9 % IV SOLN: 200.0000 mg | Freq: Once | INTRAVENOUS | Status: DC

## 2020-12-14 MED ORDER — HYDROCOD POLST-CPM POLST ER 10-8 MG/5ML PO SUER
5.0000 mL | Freq: Two times a day (BID) | ORAL | Status: DC | PRN
  Administered 2020-12-20: 5 mL via ORAL
  Filled 2020-12-14 (×3): qty 5

## 2020-12-14 MED ORDER — GUAIFENESIN-DM 100-10 MG/5ML PO SYRP
10.0000 mL | ORAL_SOLUTION | ORAL | Status: DC | PRN
  Administered 2020-12-20: 08:00:00 10 mL via ORAL
  Filled 2020-12-14: qty 10

## 2020-12-14 MED ORDER — METHYLPREDNISOLONE SODIUM SUCC 40 MG IJ SOLR
40.0000 mg | Freq: Two times a day (BID) | INTRAMUSCULAR | Status: DC
  Administered 2020-12-14 – 2020-12-15 (×3): 40 mg via INTRAVENOUS
  Filled 2020-12-14 (×3): qty 1

## 2020-12-14 MED ORDER — ACETAMINOPHEN 325 MG PO TABS: 650.0000 mg | ORAL_TABLET | Freq: Four times a day (QID) | ORAL | Status: DC | PRN

## 2020-12-14 MED ORDER — DEXAMETHASONE 6 MG PO TABS
6.0000 mg | ORAL_TABLET | ORAL | Status: DC
  Filled 2020-12-14: qty 1

## 2020-12-14 MED ORDER — ENOXAPARIN SODIUM 40 MG/0.4ML ~~LOC~~ SOLN
40.0000 mg | SUBCUTANEOUS | Status: DC
  Administered 2020-12-14 – 2020-12-23 (×9): 40 mg via SUBCUTANEOUS
  Filled 2020-12-14 (×10): qty 0.4

## 2020-12-14 MED ORDER — IPRATROPIUM-ALBUTEROL 20-100 MCG/ACT IN AERS
1.0000 | INHALATION_SPRAY | Freq: Four times a day (QID) | RESPIRATORY_TRACT | Status: DC
  Administered 2020-12-14 – 2020-12-27 (×48): 1 via RESPIRATORY_TRACT
  Filled 2020-12-14 (×3): qty 4

## 2020-12-14 MED ORDER — IOHEXOL 350 MG/ML SOLN
100.0000 mL | Freq: Once | INTRAVENOUS | Status: AC | PRN
  Administered 2020-12-14: 100 mL via INTRAVENOUS

## 2020-12-14 MED ORDER — SODIUM CHLORIDE 0.9 % IV SOLN: 100.0000 mg | Freq: Every day | INTRAVENOUS | Status: DC

## 2020-12-14 NOTE — ED Notes (Signed)
Pt's sister Kathie Rhodes) updated on pt's condition and current plan of care.

## 2020-12-14 NOTE — ED Notes (Signed)
Pt given peanut butter and crackers and water at this time

## 2020-12-14 NOTE — ED Notes (Signed)
Pt assisted up to toilet in room, unsteady on feet. Oxygen sat dropped to 82%.

## 2020-12-14 NOTE — ED Notes (Signed)
Took over care of pt. Pt resting comfortably and has no requests at this time. Awaiting further orders. Will continue to monitor.

## 2020-12-14 NOTE — Progress Notes (Addendum)
[facility]  - [place] at [facility]   PATIENT NAME: Luis Dyer    MR#:  [id number]  DATE OF BIRTH:  [birth year]  SUBJECTIVE:  patient came in with increasing shortness of breath was found of: pneumonia bilateral. Currently on 4 L nasal cannula oxygen. Intermittent exertional desaturation. Overall seems to be stable. Has some dry cough. No fever today.  REVIEW OF SYSTEMS:   Review of Systems  Constitutional: Negative for chills, fever and weight loss.  HENT: Negative for ear discharge, ear pain and nosebleeds.   Eyes: Negative for blurred vision, pain and discharge.  Respiratory: Positive for cough and shortness of breath. Negative for sputum production, wheezing and stridor.   Cardiovascular: Negative for chest pain, palpitations, orthopnea and PND.  Gastrointestinal: Negative for abdominal pain, diarrhea, nausea and vomiting.  Genitourinary: Negative for frequency and urgency.  Musculoskeletal: Negative for back pain and joint pain.  Neurological: Positive for weakness. Negative for sensory change, speech change and focal weakness.  Psychiatric/Behavioral: Negative for depression and hallucinations. The patient is not nervous/anxious.    Tolerating Diet:yes Tolerating PT:   DRUG ALLERGIES:  No Known Allergies  VITALS:  Blood pressure (!) 102/48, pulse 76, temperature 99.1 F (37.3 C), resp. rate (!) 32, height 6' (1.829 m), weight 85.3 kg, SpO2 92 %.  PHYSICAL EXAMINATION:   Physical Exam  GENERAL:  45 y.o.-year-old patient lying in the bed with no acute distress.  HEENT: Head atraumatic, normocephalic. Oropharynx and nasopharynx clear.  NECK:  Supple, no jugular venous distention. No thyroid enlargement, no tenderness.  LUNGS: Normal breath sounds bilaterally, no wheezing, rales, rhonchi. No use of accessory muscles of respiration.  CARDIOVASCULAR: S1, S2 normal. No murmurs, rubs, or gallops.  ABDOMEN: Soft, nontender, nondistended. Bowel sounds  present. No organomegaly or mass.  EXTREMITIES: No cyanosis, clubbing or edema b/l.    NEUROLOGIC: Cranial nerves II through XII are intact. No focal Motor or sensory deficits b/l.   PSYCHIATRIC:  patient is alert and oriented x 3.  SKIN: No obvious rash, lesion, or ulcer.   LABORATORY PANEL:  CBC Recent Labs  Lab 12/14/20 0413  WBC 6.6  HGB 14.2  HCT 41.4  PLT 359    Chemistries  Recent Labs  Lab 12/14/20 0413  NA 141  K 4.5  CL 106  CO2 22  GLUCOSE 175*  BUN 29*  CREATININE 1.52*  CALCIUM 8.4*  MG 2.5*  AST 45*  ALT 56*  ALKPHOS 141*  BILITOT 0.9   Cardiac Enzymes No results for input(s): TROPONINI in the last 168 hours. RADIOLOGY:  DG Chest 2 View  Result Date: 12/13/2020 CLINICAL DATA:  Left chest pain and shortness of breath. COVID-19 positive patient. EXAM: CHEST - 2 VIEW COMPARISON:  None. FINDINGS: There is extensive bilateral airspace disease consistent with pneumonia. No pneumothorax or pleural effusion. Heart size is normal. IMPRESSION: Extensive multifocal pneumonia has an appearance most compatible with COVID-19 infection. Electronically Signed   By: Drusilla Kanner M.D.   On: 12/13/2020 15:07   ASSESSMENT AND PLAN:   Luis Dyer is a 45 y.o. male with no significant PMH who presents to the ER with SOB and associated chest pain for the past 2-3 days. Reports he was seen in the ED a week ago. Treated for dehydration and gastroenteritis.SOB is fairly constant and worse with exertion  COVID-19 Pneumonia bilateral with severe hypoxic respiratory failure -Patient presenting with SOB found to be hypoxic to 87% on RA. Tachycardic. Tachypnea. -COVID-19  positive from 1/7.  -CXR shows multifocal PNA. - Stable O2 on 4L [place]. - IV solumederol and Remdesivir - cont with telemetry and continuous pulse ox  -continue supplemental O2  - D-dimer >7500, CRP 25.8  -antitussives prn -ipratropium inhaler q6h   Addendum--will do CT chest to r/o PE Chest Pain:   Suspect related to SOB and cough with COVID-19 PNA as is intermittent and only associated with coughing.  -EKG without acute ST segment changes.  -Troponin neg x2.  -CXR with PNA but no other acute findings.    Elevated Serum Cr  Cr 1.85. Consistent with lab obtained 1/7. No prior comparison. Unclear whether AKI related to acute illness versus undiagnosed CKD.  -monitor   DVT prophylaxis: Lovenox   CODE STATUS: FULL   Consults called: None   Family Communication: tried to call father (only contact in the chart)--did not pick up and no VM Admission status:  Inpatient   Dispo: The patient is from: Home              Anticipated d/c is to: Home              Anticipated d/c date is: 3 days              Patient currently is not medically stable to d/c. patient admitted with bilateral COVID pneumonia and hypoxic respiratory failure.       TOTAL TIME TAKING CARE OF THIS PATIENT: 25 minutes.  >50% time spent on counselling and coordination of care  Note: This dictation was prepared with Dragon dictation along with smaller phrase technology. Any transcriptional errors that result from this process are unintentional.  Enedina Finner M.D    [facility]   CC: Primary care physician; Patient, No Pcp PerPatient ID: Luis Dyer, male   DOB: 26-Oct-1976, 45 y.o.   MRN: [id number]

## 2020-12-15 LAB — COMPREHENSIVE METABOLIC PANEL
ALT: 121 U/L — ABNORMAL HIGH (ref 0–44)
AST: 85 U/L — ABNORMAL HIGH (ref 15–41)
Albumin: 2.1 g/dL — ABNORMAL LOW (ref 3.5–5.0)
Alkaline Phosphatase: 130 U/L — ABNORMAL HIGH (ref 38–126)
Anion gap: 11 (ref 5–15)
BUN: 35 mg/dL — ABNORMAL HIGH (ref 6–20)
CO2: 23 mmol/L (ref 22–32)
Calcium: 8.7 mg/dL — ABNORMAL LOW (ref 8.9–10.3)
Chloride: 106 mmol/L (ref 98–111)
Creatinine, Ser: 1.38 mg/dL — ABNORMAL HIGH (ref 0.61–1.24)
GFR, Estimated: >60 mL/min (ref >60)
Glucose, Bld: 196 mg/dL — ABNORMAL HIGH (ref 70–99)
Potassium: 4.4 mmol/L (ref 3.5–5.1)
Sodium: 140 mmol/L (ref 135–145)
Total Bilirubin: 0.7 mg/dL (ref 0.3–1.2)
Total Protein: 6.9 g/dL (ref 6.5–8.1)

## 2020-12-15 LAB — CBC WITH DIFFERENTIAL/PLATELET
Abs Immature Granulocytes: 0.15 10*3/uL — ABNORMAL HIGH (ref 0.00–0.07)
Basophils Absolute: 0 10*3/uL (ref 0.0–0.1)
Basophils Relative: 0 %
Eosinophils Absolute: 0 10*3/uL (ref 0.0–0.5)
Eosinophils Relative: 0 %
HCT: 42 % (ref 39.0–52.0)
Hemoglobin: 14.7 g/dL (ref 13.0–17.0)
Immature Granulocytes: 1 %
Lymphocytes Relative: 6 %
Lymphs Abs: 1.1 10*3/uL (ref 0.7–4.0)
MCH: 31.5 pg (ref 26.0–34.0)
MCHC: 35 g/dL (ref 30.0–36.0)
MCV: 89.9 fL (ref 80.0–100.0)
Monocytes Absolute: 0.6 10*3/uL (ref 0.1–1.0)
Monocytes Relative: 3 %
Neutro Abs: 16.7 10*3/uL — ABNORMAL HIGH (ref 1.7–7.7)
Neutrophils Relative %: 90 %
Platelets: 410 10*3/uL — ABNORMAL HIGH (ref 150–400)
RBC: 4.67 MIL/uL (ref 4.22–5.81)
RDW: 14 % (ref 11.5–15.5)
WBC: 18.5 10*3/uL — ABNORMAL HIGH (ref 4.0–10.5)
nRBC: 0 % (ref 0.0–0.2)

## 2020-12-15 LAB — FIBRIN DERIVATIVES D-DIMER (ARMC ONLY): Fibrin derivatives D-dimer (ARMC): 6534.47 ng/mL (FEU) — ABNORMAL HIGH (ref 0.00–499.00)

## 2020-12-15 LAB — C-REACTIVE PROTEIN: CRP: 14.1 mg/dL — ABNORMAL HIGH (ref <1.0)

## 2020-12-15 MED ORDER — METHYLPREDNISOLONE SODIUM SUCC 125 MG IJ SOLR
60.0000 mg | Freq: Two times a day (BID) | INTRAMUSCULAR | Status: DC
  Administered 2020-12-15 – 2020-12-18 (×7): 60 mg via INTRAVENOUS
  Filled 2020-12-15 (×8): qty 2

## 2020-12-15 MED ORDER — BARICITINIB 2 MG PO TABS
4.0000 mg | ORAL_TABLET | Freq: Every day | ORAL | Status: DC
  Administered 2020-12-15 – 2020-12-20 (×4): 4 mg via ORAL
  Filled 2020-12-15 (×8): qty 2

## 2020-12-15 NOTE — ED Notes (Signed)
Pt now 94% on Non-rebreather 15L and HFNC 10L. Resp therapy to stop by bedside soon per other nurse.

## 2020-12-15 NOTE — ED Notes (Signed)
Patient up to bedside commode, became SOB, saturations 78% on 15L [place]. Contacted respiratory. Placed patient on NRB. Saturations came up to 87%.

## 2020-12-15 NOTE — ED Notes (Signed)
Pt tried to lay prone, O2 dropped to the 70s. HFNC increased to 15L. SPO2 @92 %. Pt educated to use the incentive spirometer and lay supine

## 2020-12-15 NOTE — Progress Notes (Signed)
[facility]  - [place] at [facility]   PATIENT NAME: Luis Dyer    MR#:  [id number]  DATE OF BIRTH:  1976/03/17  SUBJECTIVE:  patient came in with increasing shortness of breath was found of: pneumonia bilateral.  Intermittent exertional desaturation. Overall seems to be stable. Has some dry cough. No fever today.  According the RN patient's oxygen requirement one of 215 L high flow nasal cannula. He desated down in the 70s. pt two complete sentences. Does have some cough. He is eating and drinking well.  REVIEW OF SYSTEMS:   Review of Systems  Constitutional: Negative for chills, fever and weight loss.  HENT: Negative for ear discharge, ear pain and nosebleeds.   Eyes: Negative for blurred vision, pain and discharge.  Respiratory: Positive for cough and shortness of breath. Negative for sputum production, wheezing and stridor.   Cardiovascular: Negative for chest pain, palpitations, orthopnea and PND.  Gastrointestinal: Negative for abdominal pain, diarrhea, nausea and vomiting.  Genitourinary: Negative for frequency and urgency.  Musculoskeletal: Negative for back pain and joint pain.  Neurological: Positive for weakness. Negative for sensory change, speech change and focal weakness.  Psychiatric/Behavioral: Negative for depression and hallucinations. The patient is not nervous/anxious.    Tolerating Diet:yes Tolerating PT:   DRUG ALLERGIES:  No Known Allergies  VITALS:  Blood pressure 112/81, pulse 63, temperature 98.2 F (36.8 C), temperature source Oral, resp. rate (!) 23, height 6' (1.829 m), weight 85.3 kg, SpO2 97 %.  PHYSICAL EXAMINATION:   Physical Exam  GENERAL:  45 y.o.-year-old patient lying in the bed with no acute distress.  HEENT: Head atraumatic, normocephalic. Oropharynx and nasopharynx clear.  NECK:  Supple, no jugular venous distention. No thyroid enlargement, no tenderness.  LUNGS:decreased  breath sounds bilaterally, no  wheezing, rales, rhonchi. No use of accessory muscles of respiration.  CARDIOVASCULAR: S1, S2 normal. No murmurs, rubs, or gallops.  ABDOMEN: Soft, nontender, nondistended. Bowel sounds present. No organomegaly or mass.  EXTREMITIES: No cyanosis, clubbing or edema b/l.    NEUROLOGIC: Cranial nerves II through XII are intact. No focal Motor or sensory deficits b/l.   PSYCHIATRIC:  patient is alert and oriented x 3.  SKIN: No obvious rash, lesion, or ulcer.   LABORATORY PANEL:  CBC Recent Labs  Lab 12/15/20 0508  WBC 18.5*  HGB 14.7  HCT 42.0  PLT 410*    Chemistries  Recent Labs  Lab 12/14/20 0413 12/15/20 0508  NA 141 140  K 4.5 4.4  CL 106 106  CO2 22 23  GLUCOSE 175* 196*  BUN 29* 35*  CREATININE 1.52* 1.38*  CALCIUM 8.4* 8.7*  MG 2.5*  --   AST 45* 85*  ALT 56* 121*  ALKPHOS 141* 130*  BILITOT 0.9 0.7   Cardiac Enzymes No results for input(s): TROPONINI in the last 168 hours. RADIOLOGY:  CT ANGIO CHEST PE W OR WO CONTRAST  Result Date: 12/14/2020 CLINICAL DATA:  Chest pain and COVID EXAM: CT ANGIOGRAPHY CHEST WITH CONTRAST TECHNIQUE: Multidetector CT imaging of the chest was performed using the standard protocol during bolus administration of intravenous contrast. Multiplanar CT image reconstructions and MIPs were obtained to evaluate the vascular anatomy. CONTRAST:  OMNIPAQUE IOHEXOL 350 MG/ML SOLN COMPARISON:  None. FINDINGS: Cardiovascular: Slightly suboptimal opacification of the main pulmonary artery seen. No central or proximal segmental pulmonary embolism is noted. The heart is normal in size. No pericardial effusion or thickening. No evidence right heart strain. There is normal three-vessel  brachiocephalic anatomy without proximal stenosis. The thoracic aorta is normal in appearance. Mediastinum/Nodes: No hilar, mediastinal, or axillary adenopathy. Thyroid gland, trachea, and esophagus demonstrate no significant findings. Lungs/Pleura: Extensive multifocal  patchy airspace opacities are seen throughout both lungs. Air bronchograms are seen within both lung bases. No pleural effusion or pneumothorax. Upper Abdomen: No acute abnormalities present in the visualized portions of the upper abdomen. Musculoskeletal: No chest wall abnormality. No acute or significant osseous findings. Review of the MIP images confirms the above findings. IMPRESSION: Slightly suboptimal opacification of the main pulmonary artery, however no central or proximal segmental pulmonary embolism. Extensive airspace opacities, consistent with multifocal atypical viral pneumonia. Electronically Signed   By: Jonna Clark M.D.   On: 12/14/2020 19:27   ASSESSMENT AND PLAN:   Jajuan Skoog is a 45 y.o. male with no significant PMH who presents to the ER with SOB and associated chest pain for the past 2-3 days. Reports he was seen in the ED a week ago. Treated for dehydration and gastroenteritis.SOB is fairly constant and worse with exertion  COVID-19 Pneumonia bilateral with severe hypoxic respiratory failure -Patient presenting with SOB found to be hypoxic to 87% on RA. Tachycardic. Tachypnea. -COVID-19 positive from 1/7.  -CXR shows multifocal PNA. - IV solumederol and Remdesivir - cont with telemetry and continuous pulse ox  -continue supplemental O2  - D-dimer >7500--6500 -- CRP 25.8--14.1 --antitussives prn -ipratropium inhaler q6h  -- CT chest  Slightly suboptimal opacification of the main pulmonary artery, however no central or proximal segmental pulmonary embolism. -Extensive airspace opacities, consistent with multifocal atypical viral pneumonia. --1/14--- sats stable on 4L [place] --1/15--increased o2 req to 15 L HFNC. --will start pt on Po bariticinib. Discussed with patient concerning baricitinib explained that this drug was off label use.  Had not had a randomized controlled trial.  Counseled patient however had been shown to decrease length of stay and decrease  mortality in appropriate COVID patients.  Also explained that there was a remote risk of increased blood clots.  Patient gave me permission to use medication if required  Chest Pain:  Suspect related to SOB and cough with COVID-19 PNA as is intermittent and only associated with coughing.  -EKG without acute ST segment changes.  -Troponin neg x2.  -CXR with PNA but no other acute findings.    Elevated Serum Cr  Cr 1.85. Consistent with lab obtained 1/7. No prior comparison. Unclear whether AKI related to acute illness versus undiagnosed CKD.  -monitor   DVT prophylaxis: Lovenox   CODE STATUS: FULL   Consults called: None   Family Communication: Kathie Rhodes collin sister 12/15/20  Admission status:  Inpatient   Dispo: The patient is from: Home              Anticipated d/c is to: Home              Anticipated d/c date is: 3 days              Patient currently is not medically stable to d/c. patient admitted with bilateral COVID pneumonia and hypoxic respiratory failure. Hihgh oxygen requirement       TOTAL TIME TAKING CARE OF THIS PATIENT: 25 minutes.  >50% time spent on counselling and coordination of care  Note: This dictation was prepared with Dragon dictation along with smaller phrase technology. Any transcriptional errors that result from this process are unintentional.  Enedina Finner M.D    [facility]   CC: Primary care physician;  Patient, No Pcp PerPatient ID: Sharod Petsch, male   DOB: June 19, 1976, 45 y.o.   MRN: [id number]

## 2020-12-15 NOTE — ED Notes (Signed)
Patients oxygen saturation is 85-87 on 5L . Called respiratory to come assess. Placed patient on 6L temporarily. Sats around 89-90. Awaiting new order from respiratory.

## 2020-12-15 NOTE — ED Notes (Addendum)
Pt given cup of water. Denies any other needs. Resting calmly in bed. Lights dimmed for pt as requested.

## 2020-12-16 LAB — C-REACTIVE PROTEIN: CRP: 7.5 mg/dL — ABNORMAL HIGH (ref <1.0)

## 2020-12-16 NOTE — ED Notes (Signed)
Pt resting comfortably in bed, in no distress. HFNC reduced to 12L, SPO2 @ 94%

## 2020-12-16 NOTE — Progress Notes (Signed)
[facility]  - [place] at [facility]   PATIENT NAME: Luis Dyer    MR#:  [id number]  DATE OF BIRTH:  08/01/1976  SUBJECTIVE:  patient came in with increasing shortness of breath was found of: pneumonia bilateral.  Intermittent exertional desaturation. Overall seems to be stable. Has some dry cough. No fever today.  According the RN patient's oxygen requirement 15 L high flow nasal cannula. He desated down in the 70s yday  Does have some cough. He is eating and drinking well. Remains the same  REVIEW OF SYSTEMS:   Review of Systems  Constitutional: Negative for chills, fever and weight loss.  HENT: Negative for ear discharge, ear pain and nosebleeds.   Eyes: Negative for blurred vision, pain and discharge.  Respiratory: Positive for cough and shortness of breath. Negative for sputum production, wheezing and stridor.   Cardiovascular: Negative for chest pain, palpitations, orthopnea and PND.  Gastrointestinal: Negative for abdominal pain, diarrhea, nausea and vomiting.  Genitourinary: Negative for frequency and urgency.  Musculoskeletal: Negative for back pain and joint pain.  Neurological: Positive for weakness. Negative for sensory change, speech change and focal weakness.  Psychiatric/Behavioral: Negative for depression and hallucinations. The patient is not nervous/anxious.    Tolerating Diet:yes Tolerating PT:   DRUG ALLERGIES:  No Known Allergies  VITALS:  Blood pressure 118/72, pulse 99, temperature 99 F (37.2 C), temperature source Oral, resp. rate 20, height 6' (1.829 m), weight 85.3 kg, SpO2 95 %.  PHYSICAL EXAMINATION:   Physical Exam  GENERAL:  45 y.o.-year-old patient lying in the bed with no acute distress.  HEENT: Head atraumatic, normocephalic. Oropharynx and nasopharynx clear.  NECK:  Supple, no jugular venous distention. No thyroid enlargement, no tenderness.  LUNGS:decreased  breath sounds bilaterally, no wheezing, rales, rhonchi.  No use of accessory muscles of respiration.  CARDIOVASCULAR: S1, S2 normal. No murmurs, rubs, or gallops.  ABDOMEN: Soft, nontender, nondistended. Bowel sounds present. No organomegaly or mass.  EXTREMITIES: No cyanosis, clubbing or edema b/l.    NEUROLOGIC: Cranial nerves II through XII are intact. No focal Motor or sensory deficits b/l.   PSYCHIATRIC:  patient is alert and oriented x 3.  SKIN: No obvious rash, lesion, or ulcer.   LABORATORY PANEL:  CBC Recent Labs  Lab 12/15/20 0508  WBC 18.5*  HGB 14.7  HCT 42.0  PLT 410*    Chemistries  Recent Labs  Lab 12/14/20 0413 12/15/20 0508  NA 141 140  K 4.5 4.4  CL 106 106  CO2 22 23  GLUCOSE 175* 196*  BUN 29* 35*  CREATININE 1.52* 1.38*  CALCIUM 8.4* 8.7*  MG 2.5*  --   AST 45* 85*  ALT 56* 121*  ALKPHOS 141* 130*  BILITOT 0.9 0.7   Cardiac Enzymes No results for input(s): TROPONINI in the last 168 hours. RADIOLOGY:  CT ANGIO CHEST PE W OR WO CONTRAST  Result Date: 12/14/2020 CLINICAL DATA:  Chest pain and COVID EXAM: CT ANGIOGRAPHY CHEST WITH CONTRAST TECHNIQUE: Multidetector CT imaging of the chest was performed using the standard protocol during bolus administration of intravenous contrast. Multiplanar CT image reconstructions and MIPs were obtained to evaluate the vascular anatomy. CONTRAST:  OMNIPAQUE IOHEXOL 350 MG/ML SOLN COMPARISON:  None. FINDINGS: Cardiovascular: Slightly suboptimal opacification of the main pulmonary artery seen. No central or proximal segmental pulmonary embolism is noted. The heart is normal in size. No pericardial effusion or thickening. No evidence right heart strain. There is normal three-vessel brachiocephalic anatomy  without proximal stenosis. The thoracic aorta is normal in appearance. Mediastinum/Nodes: No hilar, mediastinal, or axillary adenopathy. Thyroid gland, trachea, and esophagus demonstrate no significant findings. Lungs/Pleura: Extensive multifocal patchy airspace opacities  are seen throughout both lungs. Air bronchograms are seen within both lung bases. No pleural effusion or pneumothorax. Upper Abdomen: No acute abnormalities present in the visualized portions of the upper abdomen. Musculoskeletal: No chest wall abnormality. No acute or significant osseous findings. Review of the MIP images confirms the above findings. IMPRESSION: Slightly suboptimal opacification of the main pulmonary artery, however no central or proximal segmental pulmonary embolism. Extensive airspace opacities, consistent with multifocal atypical viral pneumonia. Electronically Signed   By: Jonna Clark M.D.   On: 12/14/2020 19:27   ASSESSMENT AND PLAN:   Luis Dyer is a 45 y.o. male with no significant PMH who presents to the ER with SOB and associated chest pain for the past 2-3 days. Reports he was seen in the ED a week ago. Treated for dehydration and gastroenteritis.SOB is fairly constant and worse with exertion  COVID-19 Pneumonia bilateral with severe hypoxic respiratory failure -Patient presenting with SOB found to be hypoxic to 87% on RA. Tachycardic. Tachypnea. -COVID-19 positive from 1/7.  -CXR shows multifocal PNA. - IV solumederol and Remdesivir - cont with telemetry and continuous pulse ox  -continue supplemental O2  - D-dimer >7500--6500 -- CRP 25.8--14.1--7.0 --antitussives prn -ipratropium inhaler q6h  -- CT chest  Slightly suboptimal opacification of the main pulmonary artery, however no central or proximal segmental pulmonary embolism. -Extensive airspace opacities, consistent with multifocal atypical viral pneumonia. --1/14--- sats stable on 4L [place] --1/15--increased o2 req to 15 L HFNC. --will start pt on Po bariticinib. Discussed with patient concerning baricitinib explained that this drug was off label use.  Had not had a randomized controlled trial.  Counseled patient however had been shown to decrease length of stay and decrease mortality in appropriate  COVID patients.  Also explained that there was a remote risk of increased blood clots.  Patient gave me permission to use medication if required --1/16-- remains on 15L HFNC, wbc 18K suspect steroid related. Cont to monitor fever curve. check pro-calcitonin.  Elevated Serum Cr  Cr 1.85. Consistent with lab obtained 1/7.  Unclear whether AKI related to acute illness versus undiagnosed CKD.  -   DVT prophylaxis: Lovenox   CODE STATUS: FULL   Consults called: None   Family Communication: Kathie Rhodes collin sister 12/15/20  Admission status:  Inpatient   Dispo: The patient is from: Home              Anticipated d/c is to: Home              Anticipated d/c date is: 3 days              Patient currently is not medically stable to d/c. patient admitted with bilateral COVID pneumonia and hypoxic respiratory failure. Hihgh oxygen requirement       TOTAL TIME TAKING CARE OF THIS PATIENT: 25 minutes.  >50% time spent on counselling and coordination of care  Note: This dictation was prepared with Dragon dictation along with smaller phrase technology. Any transcriptional errors that result from this process are unintentional.  Enedina Finner M.D    [facility]   CC: Primary care physician; Patient, No Pcp PerPatient ID: [name], male   DOB: [birth year], 45 y.o.   MRN: [id number]

## 2020-12-16 NOTE — ED Notes (Signed)
Rounds performed. Pt resting in bed and in no acute distress at this time. 

## 2020-12-17 LAB — C-REACTIVE PROTEIN: CRP: 3.9 mg/dL — ABNORMAL HIGH (ref <1.0)

## 2020-12-17 LAB — GLUCOSE, CAPILLARY: Glucose-Capillary: 156 mg/dL — ABNORMAL HIGH (ref 70–99)

## 2020-12-17 LAB — FIBRIN DERIVATIVES D-DIMER (ARMC ONLY): Fibrin derivatives D-dimer (ARMC): 5788 ng/mL (FEU) — ABNORMAL HIGH (ref 0.00–499.00)

## 2020-12-17 NOTE — Progress Notes (Signed)
[facility]  - [place] at [facility]   PATIENT NAME: Luis Dyer    MR#:  [id number]  DATE OF BIRTH:  12-Jul-1976  SUBJECTIVE:  patient came in with increasing shortness of breath was found of: pneumonia bilateral.  Intermittent exertional desaturation. Overall seems to be stable. Has some dry cough. No fever today.  According the RN patient's oxygen requirement 15 L high flow nasal cannula. He desated down in the 70s yday  Does have some cough. He is eating and drinking well. Remains the same  REVIEW OF SYSTEMS:   Review of Systems  Constitutional: Negative for chills, fever and weight loss.  HENT: Negative for ear discharge, ear pain and nosebleeds.   Eyes: Negative for blurred vision, pain and discharge.  Respiratory: Positive for cough and shortness of breath. Negative for sputum production, wheezing and stridor.   Cardiovascular: Negative for chest pain, palpitations, orthopnea and PND.  Gastrointestinal: Negative for abdominal pain, diarrhea, nausea and vomiting.  Genitourinary: Negative for frequency and urgency.  Musculoskeletal: Negative for back pain and joint pain.  Neurological: Positive for weakness. Negative for sensory change, speech change and focal weakness.  Psychiatric/Behavioral: Negative for depression and hallucinations. The patient is not nervous/anxious.    Tolerating Diet:yes Tolerating PT: not needed  DRUG ALLERGIES:  No Known Allergies  VITALS:  Blood pressure 106/75, pulse 75, temperature 98 F (36.7 C), temperature source Oral, resp. rate 18, height 6' (1.829 m), weight 85.3 kg, SpO2 97 %.  PHYSICAL EXAMINATION:   Physical Exam  GENERAL:  45 y.o.-year-old patient lying in the bed with no acute distress.  HEENT: Head atraumatic, normocephalic. Oropharynx and nasopharynx clear.  NECK:  Supple, no jugular venous distention. No thyroid enlargement, no tenderness.  LUNGS:decreased  breath sounds bilaterally, no wheezing, rales,  rhonchi. No use of accessory muscles of respiration.  CARDIOVASCULAR: S1, S2 normal. No murmurs, rubs, or gallops.  ABDOMEN: Soft, nontender, nondistended. Bowel sounds present. No organomegaly or mass.  EXTREMITIES: No cyanosis, clubbing or edema b/l.    NEUROLOGIC: Cranial nerves II through XII are intact. No focal Motor or sensory deficits b/l.   PSYCHIATRIC:  patient is alert and oriented x 3.  SKIN: No obvious rash, lesion, or ulcer.   LABORATORY PANEL:  CBC Recent Labs  Lab 12/15/20 0508  WBC 18.5*  HGB 14.7  HCT 42.0  PLT 410*    Chemistries  Recent Labs  Lab 12/14/20 0413 12/15/20 0508  NA 141 140  K 4.5 4.4  CL 106 106  CO2 22 23  GLUCOSE 175* 196*  BUN 29* 35*  CREATININE 1.52* 1.38*  CALCIUM 8.4* 8.7*  MG 2.5*  --   AST 45* 85*  ALT 56* 121*  ALKPHOS 141* 130*  BILITOT 0.9 0.7   Cardiac Enzymes No results for input(s): TROPONINI in the last 168 hours. RADIOLOGY:  No results found. ASSESSMENT AND PLAN:   Luis Dyer is a 45 y.o. male with no significant PMH who presents to the ER with SOB and associated chest pain for the past 2-3 days. Reports he was seen in the ED a week ago. Treated for dehydration and gastroenteritis.SOB is fairly constant and worse with exertion  COVID-19 Pneumonia bilateral with severe hypoxic respiratory failure -Patient presenting with SOB found to be hypoxic to 87% on RA. Tachycardic. Tachypnea. -COVID-19 positive from 1/7.  -CXR shows multifocal PNA. - IV solumederol and Remdesivir - cont with telemetry and continuous pulse ox  -continue supplemental O2  -  D-dimer >7500--6500 -- CRP 25.8--14.1--7.0 --antitussives prn -ipratropium inhaler q6h  -- CT chest  Slightly suboptimal opacification of the main pulmonary artery, however no central or proximal segmental pulmonary embolism. -Extensive airspace opacities, consistent with multifocal atypical viral pneumonia. --1/14--- sats stable on 4L [place] --1/15--increased  o2 req to 15 L HFNC. --will start pt on Po bariticinib. Discussed with patient concerning baricitinib explained that this drug was off label use.  Had not had a randomized controlled trial.  Counseled patient however had been shown to decrease length of stay and decrease mortality in appropriate COVID patients.  Also explained that there was a remote risk of increased blood clots.  Patient gave me permission to use medication if required --1/16-- remains on 15L HFNC, wbc 18K suspect steroid related. Cont to monitor fever curve. check pro-calcitonin. --1/17--down to 12 L HFNC  Elevated Serum Cr  Cr 1.85. Consistent with lab obtained 1/7.  Unclear whether AKI related to acute illness versus undiagnosed CKD.  -   DVT prophylaxis: Lovenox   CODE STATUS: FULL   Consults called: None   Family Communication: Kathie Rhodes collin sister 12/15/20  Admission status:  Inpatient   Dispo: The patient is from: Home              Anticipated d/c is to: Home              Anticipated d/c date is: 3 days              Patient currently is not medically stable to d/c. patient admitted with bilateral COVID pneumonia and hypoxic respiratory failure. Hihgh oxygen requirement       TOTAL TIME TAKING CARE OF THIS PATIENT: 25 minutes.  >50% time spent on counselling and coordination of care  Note: This dictation was prepared with Dragon dictation along with smaller phrase technology. Any transcriptional errors that result from this process are unintentional.  Enedina Finner M.D    [facility]   CC: Primary care physician; Patient, No Pcp PerPatient ID: Luis Dyer, male   DOB: 1976-01-22, 45 y.o.   MRN: [id number]

## 2020-12-18 ENCOUNTER — Encounter: Payer: Self-pay | Admitting: Internal Medicine

## 2020-12-18 LAB — FIBRIN DERIVATIVES D-DIMER (ARMC ONLY): Fibrin derivatives D-dimer (ARMC): 5537.36 ng/mL (FEU) — ABNORMAL HIGH (ref 0.00–499.00)

## 2020-12-18 NOTE — Progress Notes (Signed)
[facility]  - [place] at [facility]   PATIENT NAME: Luis Dyer    MR#:  [id number]  DATE OF BIRTH:  30-Sep-1976  SUBJECTIVE:  patient came in with increasing shortness of breath was found of: pneumonia bilateral.  Intermittent exertional desaturation. Overall seems to be stable. Has some dry cough. No fever today.  Parent became low tachycardic when try to get him out of bed to chair. Eating breakfast during my evaluation. Currently on 12 L HFNC during my evaluation. I taught him some deep breathing exercises to work on.  REVIEW OF SYSTEMS:   Review of Systems  Constitutional: Negative for chills, fever and weight loss.  HENT: Negative for ear discharge, ear pain and nosebleeds.   Eyes: Negative for blurred vision, pain and discharge.  Respiratory: Positive for cough and shortness of breath. Negative for sputum production, wheezing and stridor.   Cardiovascular: Negative for chest pain, palpitations, orthopnea and PND.  Gastrointestinal: Negative for abdominal pain, diarrhea, nausea and vomiting.  Genitourinary: Negative for frequency and urgency.  Musculoskeletal: Negative for back pain and joint pain.  Neurological: Positive for weakness. Negative for sensory change, speech change and focal weakness.  Psychiatric/Behavioral: Negative for depression and hallucinations. The patient is not nervous/anxious.    Tolerating Diet:yes Tolerating PT: not needed  DRUG ALLERGIES:  No Known Allergies  VITALS:  Blood pressure 101/79, pulse 95, temperature 97.9 F (36.6 C), temperature source Oral, resp. rate (!) 22, height 6' (1.829 m), weight 74.2 kg, SpO2 94 %.  PHYSICAL EXAMINATION:   Physical Exam  GENERAL:  45 y.o.-year-old patient lying in the bed with no acute distress.  HEENT: Head atraumatic, normocephalic. Oropharynx and nasopharynx clear.   LUNGS:decreased  breath sounds bilaterally, no wheezing, rales, rhonchi. No use of accessory muscles of  respiration.  CARDIOVASCULAR: S1, S2 normal. No murmurs, rubs, or gallops. Mild tachy ABDOMEN: Soft, nontender, nondistended. Bowel sounds present. No organomegaly or mass.  EXTREMITIES: No cyanosis, clubbing or edema b/l.    NEUROLOGIC: Cranial nerves II through XII are intact. No focal Motor or sensory deficits b/l.   PSYCHIATRIC:  patient is alert and oriented x 3.  SKIN: No obvious rash, lesion, or ulcer.   LABORATORY PANEL:  CBC Recent Labs  Lab 12/15/20 0508  WBC 18.5*  HGB 14.7  HCT 42.0  PLT 410*    Chemistries  Recent Labs  Lab 12/14/20 0413 12/15/20 0508  NA 141 140  K 4.5 4.4  CL 106 106  CO2 22 23  GLUCOSE 175* 196*  BUN 29* 35*  CREATININE 1.52* 1.38*  CALCIUM 8.4* 8.7*  MG 2.5*  --   AST 45* 85*  ALT 56* 121*  ALKPHOS 141* 130*  BILITOT 0.9 0.7   Cardiac Enzymes No results for input(s): TROPONINI in the last 168 hours. RADIOLOGY:  No results found. ASSESSMENT AND PLAN:   [name] is a 45 y.o. male with no significant PMH who presents to the ER with SOB and associated chest pain for the past 2-3 days. Reports he was seen in the ED a week ago. Treated for dehydration and gastroenteritis.SOB is fairly constant and worse with exertion  COVID-19 Pneumonia bilateral with severe hypoxic respiratory failure -Patient presenting with SOB found to be hypoxic to 87% on RA. Tachycardic. Tachypnea. -COVID-19 positive from 1/7.  -CXR shows multifocal PNA. - IV solumederol and Remdesivir - cont with telemetry and continuous pulse ox  -continue supplemental O2  - D-dimer >7500--6500 -- CRP 25.8--14.1--7.0 --  antitussives prn -ipratropium inhaler q6h  -- CT chest  Slightly suboptimal opacification of the main pulmonary artery, however no central or proximal segmental pulmonary embolism. -Extensive airspace opacities, consistent with multifocal atypical viral pneumonia. --1/14--- sats stable on 4L [place] --1/15--increased o2 req to 15 L HFNC. --will  start pt on Po bariticinib. Discussed with patient concerning baricitinib explained that this drug was off label use.  Had not had a randomized controlled trial.  Counseled patient however had been shown to decrease length of stay and decrease mortality in appropriate COVID patients.  Also explained that there was a remote risk of increased blood clots.  Patient gave me permission to use medication if required --1/16-- remains on 15L HFNC, wbc 18K suspect steroid related. Cont to monitor fever curve. check pro-calcitonin. --1/17--down to 12 L HFNC --1/18--remains on 12L HFNC cont to wean to keep sats >85%, deep breathing exercises taught to patient  Acute renal failure Cr 1.85.Unclear whether AKI related to acute illness versus undiagnosed CKD.  - no baseline labs available --1.85--1.52--1.3  DVT prophylaxis: Lovenox   CODE STATUS: FULL   Consults called: None   Family Communication: Kathie Rhodes collin sister 12/18/20 --please call her for update Admission status:  Inpatient   Dispo: The patient is from: Home              Anticipated d/c is to: Home              Anticipated d/c date is: TBD              Patient currently is not medically stable to d/c. patient admitted with bilateral COVID pneumonia and hypoxic respiratory failure. Hihgh oxygen requirement--slow wean       TOTAL TIME TAKING CARE OF THIS PATIENT: 25 minutes.  >50% time spent on counselling and coordination of care  Note: This dictation was prepared with Dragon dictation along with smaller phrase technology. Any transcriptional errors that result from this process are unintentional.  Enedina Finner M.D    [facility]   CC: Primary care physician; Patient, No Pcp PerPatient ID: Luis Dyer, male   DOB: 1976-01-01, 45 y.o.   MRN: [id number]

## 2020-12-18 NOTE — Plan of Care (Signed)
  Problem: Education: Goal: Knowledge of risk factors and measures for prevention of condition will improve Outcome: Progressing   Problem: Coping: Goal: Psychosocial and spiritual needs will be supported Outcome: Progressing   Problem: Respiratory: Goal: Will maintain a patent airway Outcome: Progressing Goal: Complications related to the disease process, condition or treatment will be avoided or minimized Outcome: Progressing   

## 2020-12-19 ENCOUNTER — Encounter: Payer: Self-pay | Admitting: Internal Medicine

## 2020-12-19 DIAGNOSIS — N189 Chronic kidney disease, unspecified: Secondary | ICD-10-CM

## 2020-12-19 DIAGNOSIS — R531 Weakness: Secondary | ICD-10-CM

## 2020-12-19 DIAGNOSIS — N179 Acute kidney failure, unspecified: Secondary | ICD-10-CM

## 2020-12-19 MED ORDER — METHYLPREDNISOLONE SODIUM SUCC 40 MG IJ SOLR
40.0000 mg | Freq: Two times a day (BID) | INTRAMUSCULAR | Status: DC
  Administered 2020-12-19 – 2020-12-24 (×11): 40 mg via INTRAVENOUS
  Filled 2020-12-19 (×11): qty 1

## 2020-12-19 NOTE — Plan of Care (Signed)
Report called and given to [facility] on 1C.

## 2020-12-19 NOTE — Progress Notes (Signed)
Patient ID: Luis Dyer, male   DOB: 1976/10/15, 45 y.o.   MRN: [id number] [facility] PROGRESS NOTE  Landy Dunnavant UUV:[id number] DOB: 07/30/1976 DOA: 12/13/2020 PCP: Patient, No Pcp Per  HPI/Subjective: Patient feeling a little bit better.  Admitted 12/13/2020 with shortness of breath, COVID-19 pneumonia and acute hypoxic respiratory failure.  Patient able to get up on his own and stand up and sit in the chair without any worsening shortness of breath today while is in the room.  Objective: Vitals:   12/19/20 0041 12/19/20 0425  BP:  101/76  Pulse:  94  Resp:  19  Temp:  98.1 F (36.7 C)  SpO2: 93% 94%    Intake/Output Summary (Last 24 hours) at 12/19/2020 1457 Last data filed at 12/19/2020 0541 Gross per 24 hour  Intake 240 ml  Output 401 ml  Net -161 ml   Filed Weights   12/13/20 1423 12/18/20 0537 12/19/20 0146  Weight: 85.3 kg 74.2 kg 90.9 kg    ROS: Review of Systems  Respiratory: Positive for shortness of breath. Negative for cough.   Cardiovascular: Negative for chest pain.  Gastrointestinal: Negative for abdominal pain, nausea and vomiting.   Exam: Physical Exam HENT:     Head: Normocephalic.     Mouth/Throat:     Pharynx: No oropharyngeal exudate.  Eyes:     General: Lids are normal.     Pupils: Pupils are equal, round, and reactive to light.  Cardiovascular:     Rate and Rhythm: Regular rhythm. Tachycardia present.     Heart sounds: Normal heart sounds, S1 normal and S2 normal.  Pulmonary:     Breath sounds: Examination of the right-lower field reveals decreased breath sounds and rhonchi. Examination of the left-lower field reveals decreased breath sounds and rhonchi. Decreased breath sounds and rhonchi present. No wheezing or rales.  Abdominal:     Palpations: Abdomen is soft.     Tenderness: There is no abdominal tenderness.  Musculoskeletal:     Right lower leg: No swelling.     Left lower leg: No swelling.  Skin:    General: Skin  is warm.     Findings: No rash.  Neurological:     Mental Status: He is alert and oriented to person, place, and time.       Data Reviewed: Basic Metabolic Panel: Recent Labs  Lab 12/13/20 1427 12/14/20 0413 12/15/20 0508  NA 138 141 140  K 3.6 4.5 4.4  CL 103 106 106  CO2 22 22 23   GLUCOSE 107* 175* 196*  BUN 20 29* 35*  CREATININE 1.85* 1.52* 1.38*  CALCIUM 8.1* 8.4* 8.7*  MG  --  2.5*  --   PHOS  --  5.4*  --    Liver Function Tests: Recent Labs  Lab 12/14/20 0413 12/15/20 0508  AST 45* 85*  ALT 56* 121*  ALKPHOS 141* 130*  BILITOT 0.9 0.7  PROT 6.9 6.9  ALBUMIN 2.1* 2.1*   CBC: Recent Labs  Lab 12/13/20 1427 12/14/20 0413 12/15/20 0508  WBC 10.1 6.6 18.5*  NEUTROABS  --  5.3 16.7*  HGB 15.1 14.2 14.7  HCT 43.7 41.4 42.0  MCV 89.9 90.0 89.9  PLT 350 359 410*    CBG: Recent Labs  Lab 12/17/20 2030  GLUCAP 156*   Scheduled Meds: . baricitinib  4 mg Oral Daily  . enoxaparin (LOVENOX) injection  40 mg Subcutaneous Q24H  . Ipratropium-Albuterol  1 puff Inhalation Q6H  .  methylPREDNISolone (SOLU-MEDROL) injection  40 mg Intravenous Q12H   Continuous Infusions:  Assessment/Plan:  1. Acute hypoxic respiratory failure secondary to COVID-19 pneumonia.  Patient had initial pulse ox of 87% on room air on admission.  Patient currently on 10 L [place] high flow nasal cannula.  Patient completed remdesivir.  Continue baricitinib and Solu-Medrol.  Initial CT scan of the chest negative for pulmonary embolism.  Encourage prone positioning.  Patient told his sister that he could not do it without help.  We will have nursing staff to try to help out with prone positioning. 2. Acute renal failure.  Creatinine improved from 1.85 on admission down to 1.38.  We will recheck again tomorrow. 3. Weakness.  We will get physical therapy evaluation.     Code Status:     Code Status Orders  (From admission, onward)         Start     Ordered   12/14/20 0058  Full  code  Continuous        12/14/20 0057        Code Status History    This patient has a current code status but no historical code status.   Advance Care Planning Activity     Family Communication: Spoke with patient's sister on the phone Disposition Plan: Status is: Inpatient  Dispo: The patient is from: Home              Anticipated d/c is to: Home              Anticipated d/c date is: Likely another 3 to 4 days at least here in the hospital              Patient currently on [place] high flow nasal cannula 10 L/min.  Time spent: 28 minutes  [employer]

## 2020-12-20 ENCOUNTER — Inpatient Hospital Stay: Payer: HRSA Program

## 2020-12-20 DIAGNOSIS — N189 Chronic kidney disease, unspecified: Secondary | ICD-10-CM

## 2020-12-20 DIAGNOSIS — R7989 Other specified abnormal findings of blood chemistry: Secondary | ICD-10-CM

## 2020-12-20 DIAGNOSIS — R7301 Impaired fasting glucose: Secondary | ICD-10-CM

## 2020-12-20 LAB — CBC WITH DIFFERENTIAL/PLATELET
Abs Immature Granulocytes: 0.21 10*3/uL — ABNORMAL HIGH (ref 0.00–0.07)
Basophils Absolute: 0 10*3/uL (ref 0.0–0.1)
Basophils Relative: 0 %
Eosinophils Absolute: 0 10*3/uL (ref 0.0–0.5)
Eosinophils Relative: 0 %
HCT: 48.3 % (ref 39.0–52.0)
Hemoglobin: 16.4 g/dL (ref 13.0–17.0)
Immature Granulocytes: 1 %
Lymphocytes Relative: 5 %
Lymphs Abs: 0.7 10*3/uL (ref 0.7–4.0)
MCH: 31.4 pg (ref 26.0–34.0)
MCHC: 34 g/dL (ref 30.0–36.0)
MCV: 92.4 fL (ref 80.0–100.0)
Monocytes Absolute: 0.4 10*3/uL (ref 0.1–1.0)
Monocytes Relative: 3 %
Neutro Abs: 13.8 10*3/uL — ABNORMAL HIGH (ref 1.7–7.7)
Neutrophils Relative %: 91 %
Platelets: 428 10*3/uL — ABNORMAL HIGH (ref 150–400)
RBC: 5.23 MIL/uL (ref 4.22–5.81)
RDW: 13.5 % (ref 11.5–15.5)
WBC: 15.2 10*3/uL — ABNORMAL HIGH (ref 4.0–10.5)
nRBC: 0 % (ref 0.0–0.2)

## 2020-12-20 LAB — BASIC METABOLIC PANEL
Anion gap: 8 (ref 5–15)
BUN: 45 mg/dL — ABNORMAL HIGH (ref 6–20)
CO2: 28 mmol/L (ref 22–32)
Calcium: 8.5 mg/dL — ABNORMAL LOW (ref 8.9–10.3)
Chloride: 101 mmol/L (ref 98–111)
Creatinine, Ser: 1.74 mg/dL — ABNORMAL HIGH (ref 0.61–1.24)
GFR, Estimated: 49 mL/min — ABNORMAL LOW (ref >60)
Glucose, Bld: 190 mg/dL — ABNORMAL HIGH (ref 70–99)
Potassium: 4.8 mmol/L (ref 3.5–5.1)
Sodium: 137 mmol/L (ref 135–145)

## 2020-12-20 LAB — C-REACTIVE PROTEIN: CRP: 0.9 mg/dL (ref <1.0)

## 2020-12-20 LAB — D-DIMER, QUANTITATIVE: D-Dimer, Quant: 9.91 ug/mL-FEU — ABNORMAL HIGH (ref 0.00–0.50)

## 2020-12-20 MED ORDER — BARICITINIB 2 MG PO TABS
2.0000 mg | ORAL_TABLET | Freq: Every day | ORAL | Status: DC
  Administered 2020-12-21: 2 mg via ORAL
  Filled 2020-12-20: qty 1

## 2020-12-20 MED ORDER — SODIUM CHLORIDE 0.9 % IV SOLN: INTRAVENOUS | Status: DC

## 2020-12-20 NOTE — TOC Initial Note (Signed)
Transition of Care [facility]) - Initial/Assessment Note    Patient Details  Name: Luis Dyer MRN: [id number] Date of Birth: Sep 23, 1976  Transition of Care [facility]) CM/SW Contact:    Shelbie Hutching, RN Phone Number: 12/20/2020, 10:33 AM  Clinical Narrative:                 Patient admitted to the hospital with COVID currently requiring HFNC at 9L.  RNCM met with patient at the bedside.  Patient reports that he is from home where he lives with his parents.  Patient is independent and works at [employer].  Patient does not currently have any insurance or a PCP.  RNCM will provide resources for [facility] and [facility].  Referral made to [facility].  RNCM will also provide patient with the [place] for free and low cost healthcare options in [facility].   TOC will cont to follow for needs.   Expected Discharge Plan: Home/Self Care Barriers to Discharge: Continued Medical Work up   Patient Goals and CMS Choice Patient states their goals for this hospitalization and ongoing recovery are:: to get better and get home      Expected Discharge Plan and Services Expected Discharge Plan: Home/Self Care   Discharge Planning Services: CM Consult,Medication [facility]   Living arrangements for the past 2 months: Single Family Home                                      Prior Living Arrangements/Services Living arrangements for the past 2 months: Single Family Home Lives with:: Parents Patient language and need for interpreter reviewed:: Yes Do you feel safe going back to the place where you live?: Yes      Need for Family Participation in Patient Care: Yes (Comment) (COVID) Care giver support system in place?: Yes (comment) (family)   Criminal Activity/Legal Involvement Pertinent to Current Situation/Hospitalization: No - Comment as needed  Activities of Daily Living Home Assistive Devices/Equipment: None ADL  Screening (condition at time of admission) Patient's cognitive ability adequate to safely complete daily activities?: Yes Is the patient deaf or have difficulty hearing?: No Does the patient have difficulty seeing, even when wearing glasses/contacts?: No Does the patient have difficulty concentrating, remembering, or making decisions?: No Patient able to express need for assistance with ADLs?: Yes Does the patient have difficulty dressing or bathing?: No Independently performs ADLs?: Yes (appropriate for developmental age) Does the patient have difficulty walking or climbing stairs?: No Weakness of Legs: None Weakness of Arms/Hands: None  Permission Sought/Granted Permission sought to share information with : Case Manager,Family Supports,Other (comment) Permission granted to share information with : Yes, Verbal Permission Granted     Permission granted to share info w AGENCY: [employer] for home oxygen if needed        Emotional Assessment Appearance:: Appears stated age Attitude/Demeanor/Rapport: Engaged Affect (typically observed): Accepting Orientation: : Oriented to Self,Oriented to Place,Oriented to  Time,Oriented to Situation Alcohol / Substance Use: Not Applicable Psych Involvement: No (comment)  Admission diagnosis:  Acute respiratory failure with hypoxia ([place]) [J96.01] COVID-19 virus infection [U07.1] Pneumonia due to COVID-19 virus [U07.1, J12.82] Patient Active Problem List   Diagnosis Date Noted  . Acute renal failure ([place])   . Weakness   . Acute respiratory failure with hypoxia ([place])   . COVID-19 virus infection 12/13/2020  . Pneumonia due to  COVID-19 virus 12/13/2020  . Elevated serum creatinine 12/13/2020   PCP:  Patient, No Pcp Per Pharmacy:   [facility], [place] [address] [place] [address] Phone: [phone] Fax: [phone]     Social Determinants of Health (SDOH) Interventions    Readmission Risk  Interventions No flowsheet data found.

## 2020-12-20 NOTE — Progress Notes (Signed)
PHARMACY NOTE:  ANTIMICROBIAL RENAL DOSAGE ADJUSTMENT  Current antimicrobial regimen includes a mismatch between antimicrobial dosage and estimated renal function.  As per policy approved by the Pharmacy & Therapeutics and Medical Executive Committees, the antimicrobial dosage will be adjusted accordingly.  Current antimicrobial dosage:  baricitinib 4mg  qd  Indication: COVID PNA  Renal Function:  Est GFR < 60 ml/min  Estimated Creatinine Clearance: 60.1 mL/min (A) (by C-G formula based on SCr of 1.74 mg/dL (H)). []      On intermittent HD, scheduled: []      On CRRT    Antimicrobial dosage has been changed to:  baricitinib 2mg  qd  Additional comments:   Thank you for allowing pharmacy to be a part of this patient's care.  , PharmD, BCPS Clinical Pharmacist 12/20/2020 2:43 PM

## 2020-12-20 NOTE — Evaluation (Signed)
Physical Therapy Evaluation Patient Details Name: Luis Dyer MRN: [id number] DOB: 1976/02/07 Today's Date: 12/20/2020   History of Present Illness  Pt is a 45 y.o. male presenting to hospital 1/13 with L sided chest pain and SOB (gradual onset and worsening over past 3-4 days); (+) COVID 1/7.  Pt seen in ED 1 week prior for gastroenteritis and dehydration.  Pt admitted with COVID-19 PNA, chest pain, acute hypoxic respiratory failure, and acute renal failure.  PMH includes facial fx surgery.  Clinical Impression  Prior to hospital admission, pt was independent and working full time; lives with his parents on main level of home with 2-3 STE B railings.  Currently pt is modified independent with bed mobility; CGA with transfers; and CGA ambulating 30 feet with single UE support on IV pole (limited distance ambulating d/t SOB and increased work of breathing).  Pt's O2 sats 93% on 5 L O2 via nasal cannula at rest; unable to get O2 reading with good waveform during/immediately post ambulation but eventually pt's O2 sats noted to be 87% and within a few minutes O2 sats increased to 92% at rest on 5 L (nurse notified).  After ambulation, pt reporting burning L calf pain when walking (none at rest but L calf was painful to palpation): nurse notified immediately and reporting MD recently placed order for US venous B LE to r/o DVT (order placed during therapy session); MD also notified of findings via secure messaging.  Pt would benefit from skilled PT to address noted impairments and functional limitations (see below for any additional details).  Upon hospital discharge, pt would benefit from HHPT.    Follow Up Recommendations Home health PT    Equipment Recommendations  None recommended by PT    Recommendations for Other Services OT consult     Precautions / Restrictions Precautions Precautions: Fall Restrictions Weight Bearing Restrictions: No      Mobility  Bed Mobility Overal bed  mobility: Modified Independent             General bed mobility comments: Semi-supine to/from sitting edge of bed with mild increased effort    Transfers Overall transfer level: Needs assistance Equipment used: None Transfers: Sit to/from Stand Sit to Stand: Min guard         General transfer comment: mild increased effort to stand from bed  Ambulation/Gait Ambulation/Gait assistance: Min guard Gait Distance (Feet): 30 Feet Assistive device: IV Pole   Gait velocity: decreased   General Gait Details: partial step through gait pattern; limited distance d/t SOB  Stairs            Wheelchair Mobility    Modified Rankin (Stroke Patients Only)       Balance Overall balance assessment: Needs assistance Sitting-balance support: No upper extremity supported;Feet supported Sitting balance-Leahy Scale: Normal Sitting balance - Comments: steady sitting reaching outside BOS   Standing balance support: Single extremity supported;During functional activity Standing balance-Leahy Scale: Fair Standing balance comment: steady ambulating with at least single UE support                             Pertinent Vitals/Pain Pain Assessment: 0-10 Pain Score: 0-No pain Pain Location: L calf when resting Pain Descriptors / Indicators: Burning Pain Intervention(s): Limited activity within patient's tolerance;Monitored during session;Repositioned;Other (comment) (RN notified)    Home Living Family/patient expects to be discharged to:: Private residence Living Arrangements: Parent (pt's parents) Available Help at Discharge: Family  Type of Home: House Home Access: Stairs to enter Entrance Stairs-Rails: Right;Left;Can reach both Entrance Stairs-Number of Steps: 2-3 Home Layout: Two level;Able to live on main level with bedroom/bathroom Home Equipment: Grab bars - tub/shower      Prior Function Level of Independence: Independent         Comments: Works at  [employer] 60 hours per week     Hand Dominance        Extremity/Trunk Assessment   Upper Extremity Assessment Upper Extremity Assessment: Generalized weakness    Lower Extremity Assessment Lower Extremity Assessment: Generalized weakness    Cervical / Trunk Assessment Cervical / Trunk Assessment: Normal  Communication   Communication: No difficulties  Cognition Arousal/Alertness: Awake/alert Behavior During Therapy: WFL for tasks assessed/performed Overall Cognitive Status: Within Functional Limits for tasks assessed                                        General Comments   Nursing cleared pt for participation in physical therapy.  Pt agreeable to PT session.    Exercises  Pursed lip breathing; upright sitting posture; pacing   Assessment/Plan    PT Assessment Patient needs continued PT services  PT Problem List Decreased strength;Decreased activity tolerance;Decreased balance;Decreased mobility;Decreased knowledge of use of DME;Decreased knowledge of precautions;Cardiopulmonary status limiting activity;Pain       PT Treatment Interventions DME instruction;Gait training;Stair training;Functional mobility training;Therapeutic activities;Therapeutic exercise;Balance training;Patient/family education    PT Goals (Current goals can be found in the Care Plan section)  Acute Rehab PT Goals Patient Stated Goal: to go home PT Goal Formulation: With patient Time For Goal Achievement: 01/03/21 Potential to Achieve Goals: Good    Frequency Min 2X/week   Barriers to discharge        Co-evaluation               AM-PAC PT "6 Clicks" Mobility  Outcome Measure Help needed turning from your back to your side while in a flat bed without using bedrails?: None Help needed moving from lying on your back to sitting on the side of a flat bed without using bedrails?: None Help needed moving to and from a bed to a chair (including a wheelchair)?: A Little Help  needed standing up from a chair using your arms (e.g., wheelchair or bedside chair)?: A Little Help needed to walk in hospital room?: A Little Help needed climbing 3-5 steps with a railing? : A Little 6 Click Score: 20    End of Session Equipment Utilized During Treatment: Gait belt;Oxygen (5 L O2 via nasal cannula) Activity Tolerance: Other (comment) (Limited d/t SOB and increased WOB with ambulation) Patient left: in bed;with call bell/phone within reach Nurse Communication: Mobility status;Precautions;Other (comment) (pt's L calf pain with walking) PT Visit Diagnosis: Other abnormalities of gait and mobility (R26.89);Muscle weakness (generalized) (M62.81);Difficulty in walking, not elsewhere classified (R26.2);Pain Pain - Right/Left: Left Pain - part of body: Leg    Time: 6237-6283 PT Time Calculation (min) (ACUTE ONLY): 27 min   Charges:   PT Evaluation $PT Eval Low Complexity: 1 Low PT Treatments $Therapeutic Activity: 8-22 mins       Hendricks Limes, PT 12/20/20, 3:54 PM

## 2020-12-20 NOTE — Progress Notes (Signed)
Patient ID: Luis Dyer, male   DOB: May 11, 1976, 45 y.o.   MRN: [id number] [facility] PROGRESS NOTE  Oscar Hank DPO:[id number] DOB: 10/24/1976 DOA: 12/13/2020 PCP: Patient, No Pcp Per  HPI/Subjective: Patient feeling better.  Breathing better.  No pain.  Able to move around a little bit better.  This morning was tapered from salter high flow nasal cannula over to 6 L nasal cannula.  Objective: Vitals:   12/20/20 1103 12/20/20 1143  BP:  110/68  Pulse:  87  Resp:  20  Temp:  98.1 F (36.7 C)  SpO2: 94% 97%    Intake/Output Summary (Last 24 hours) at 12/20/2020 1410 Last data filed at 12/20/2020 0900 Gross per 24 hour  Intake --  Output 600 ml  Net -600 ml   Filed Weights   12/13/20 1423 12/18/20 0537 12/19/20 0146  Weight: 85.3 kg 74.2 kg 90.9 kg    ROS: Review of Systems  Respiratory: Positive for shortness of breath. Negative for cough.   Cardiovascular: Negative for chest pain.  Gastrointestinal: Negative for abdominal pain and vomiting.   Exam: Physical Exam HENT:     Head: Normocephalic.     Mouth/Throat:     Pharynx: No oropharyngeal exudate.  Eyes:     General: Lids are normal.     Conjunctiva/sclera: Conjunctivae normal.     Pupils: Pupils are equal, round, and reactive to light.  Cardiovascular:     Rate and Rhythm: Normal rate and regular rhythm.     Heart sounds: Normal heart sounds, S1 normal and S2 normal.  Pulmonary:     Breath sounds: Examination of the right-lower field reveals decreased breath sounds. Examination of the left-lower field reveals decreased breath sounds. Decreased breath sounds present. No wheezing, rhonchi or rales.  Abdominal:     Palpations: Abdomen is soft.     Tenderness: There is no abdominal tenderness.  Skin:    General: Skin is warm.     Findings: No rash.  Neurological:     Mental Status: He is alert and oriented to person, place, and time.       Data Reviewed: Basic Metabolic Panel: Recent  Labs  Lab 12/13/20 1427 12/14/20 0413 12/15/20 0508 12/20/20 0431  NA 138 141 140 137  K 3.6 4.5 4.4 4.8  CL 103 106 106 101  CO2 22 22 23 28   GLUCOSE 107* 175* 196* 190*  BUN 20 29* 35* 45*  CREATININE 1.85* 1.52* 1.38* 1.74*  CALCIUM 8.1* 8.4* 8.7* 8.5*  MG  --  2.5*  --   --   PHOS  --  5.4*  --   --    Liver Function Tests: Recent Labs  Lab 12/14/20 0413 12/15/20 0508  AST 45* 85*  ALT 56* 121*  ALKPHOS 141* 130*  BILITOT 0.9 0.7  PROT 6.9 6.9  ALBUMIN 2.1* 2.1*   CBC: Recent Labs  Lab 12/13/20 1427 12/14/20 0413 12/15/20 0508 12/20/20 0431  WBC 10.1 6.6 18.5* 15.2*  NEUTROABS  --  5.3 16.7* 13.8*  HGB 15.1 14.2 14.7 16.4  HCT 43.7 41.4 42.0 48.3  MCV 89.9 90.0 89.9 92.4  PLT 350 359 410* 428*    CBG: Recent Labs  Lab 12/17/20 2030  GLUCAP 156*     Scheduled Meds: . baricitinib  4 mg Oral Daily  . enoxaparin (LOVENOX) injection  40 mg Subcutaneous Q24H  . Ipratropium-Albuterol  1 puff Inhalation Q6H  . methylPREDNISolone (SOLU-MEDROL) injection  40 mg Intravenous  Q12H   Continuous Infusions: . sodium chloride 50 mL/hr at 12/20/20 0751    Assessment/Plan:  1. Acute hypoxic respiratory failure secondary to COVID-19 pneumonia.  Initial pulse ox of 87% on room air.  This morning was on 6 L salter high flow nasal cannula.  This afternoon able to be tapered over the regular nasal cannula 6 L.  Continue baricitinib and Solu-Medrol.  Initial CT scan of the chest negative for pulmonary embolism.  Patient was able to do prone positioning yesterday.  Check pulse ox on room air tomorrow. 2. 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  Restarted IV fluids today with creatinine up at 1.74.  Recheck creatinine and liver function test tomorrow. 3. Elevated D-dimer.  Not quite sure why they did a D-dimer instead of the fibrin derivatives but seems a little bit more elevated than yesterday.  With elevated creatinine I am not can get another CAT  scan of the chest at this point we will get an ultrasound of the lower extremities.  Recheck creatinine tomorrow. 4. Impaired fasting glucose.  Sugars elevated with steroids. 5. Weakness.  Physical therapy eval      Code Status:     Code Status Orders  (From admission, onward)         Start     Ordered   12/14/20 0058  Full code  Continuous        12/14/20 0057        Code Status History    This patient has a current code status but no historical code status.   Advance Care Planning Activity     Family Communication: Spoke with patient's sister on the phone Disposition Plan: Status is: Inpatient  Dispo: The patient is from: Home              Anticipated d/c is to: Home              Anticipated d/c date is: Potentially 12/22/2019 versus 12/22/2020.              Patient currently switched over from salter high flow nasal cannula to regular nasal cannula today.  Still on 6 L.  Little too much to go home with at this point.  Time spent: 28 minutes  Rolande [employer]

## 2020-12-21 LAB — HEPATIC FUNCTION PANEL
ALT: 75 U/L — ABNORMAL HIGH (ref 0–44)
AST: 21 U/L (ref 15–41)
Albumin: 2.2 g/dL — ABNORMAL LOW (ref 3.5–5.0)
Alkaline Phosphatase: 101 U/L (ref 38–126)
Bilirubin, Direct: 0.2 mg/dL (ref 0.0–0.2)
Indirect Bilirubin: 0.8 mg/dL (ref 0.3–0.9)
Total Bilirubin: 1 mg/dL (ref 0.3–1.2)
Total Protein: 5.9 g/dL — ABNORMAL LOW (ref 6.5–8.1)

## 2020-12-21 LAB — BASIC METABOLIC PANEL
Anion gap: 7 (ref 5–15)
BUN: 37 mg/dL — ABNORMAL HIGH (ref 6–20)
CO2: 27 mmol/L (ref 22–32)
Calcium: 8.3 mg/dL — ABNORMAL LOW (ref 8.9–10.3)
Chloride: 101 mmol/L (ref 98–111)
Creatinine, Ser: 1.47 mg/dL — ABNORMAL HIGH (ref 0.61–1.24)
GFR, Estimated: >60 mL/min (ref >60)
Glucose, Bld: 210 mg/dL — ABNORMAL HIGH (ref 70–99)
Potassium: 4.7 mmol/L (ref 3.5–5.1)
Sodium: 135 mmol/L (ref 135–145)

## 2020-12-21 LAB — FIBRIN DERIVATIVES D-DIMER (ARMC ONLY): Fibrin derivatives D-dimer (ARMC): 4206.4 ng/mL (FEU) — ABNORMAL HIGH (ref 0.00–499.00)

## 2020-12-21 MED ORDER — BARICITINIB 2 MG PO TABS
4.0000 mg | ORAL_TABLET | Freq: Every day | ORAL | Status: DC
  Administered 2020-12-22 – 2020-12-27 (×6): 4 mg via ORAL
  Filled 2020-12-21 (×6): qty 2

## 2020-12-21 MED ORDER — BENZONATATE 100 MG PO CAPS
100.0000 mg | ORAL_CAPSULE | Freq: Three times a day (TID) | ORAL | Status: DC
Start: 2020-12-21 — End: 2020-12-27
  Administered 2020-12-21 – 2020-12-27 (×17): 100 mg via ORAL
  Filled 2020-12-21 (×19): qty 1

## 2020-12-21 NOTE — Progress Notes (Signed)
SATURATION QUALIFICATIONS: (This note is used to comply with regulatory documentation for home oxygen)  Patient Saturations on Room Air at Rest = 85% at rest on room air.  Patient Saturations on Room Air while Ambulating = sitting at bedside on room air, patient desaturated to 83%.   Patient Saturations on  Liters of oxygen while Ambulating = applied O2 to 6L [place] and he sat on edge of bed for 10 minutes before O2 increased to 89%. Ambulated with 6L [place], O2 stayed around 88%.   Please briefly explain why patient needs home oxygen: Patient was lying in bed with call bell in reach. Removed oxygen to determine home O2 needs. At rest on RA, patient was saturating 87%. Moved to side of bed, patient O2 sat registered 83% on RA. Applied oxygen at 6L [place]. Rested on side of bed for 10 minutes, oxygen level came up to 88%. We walked to door and back to bed, saturation dropped to 84% on 6L. Got back to bed and situated oxygen level increased to 90%. Bed in low position and call bell in reach.

## 2020-12-21 NOTE — Evaluation (Signed)
Occupational Therapy Evaluation Patient Details Name: Luis Dyer MRN: [id number] DOB: Jul 30, 1976 Today's Date: 12/21/2020    History of Present Illness Pt is a 45 y.o. male presenting to hospital 1/13 with L sided chest pain and SOB (gradual onset and worsening over past 3-4 days); (+) COVID 1/7.  Pt seen in ED 1 week prior for gastroenteritis and dehydration.  Pt admitted with COVID-19 PNA, chest pain, acute hypoxic respiratory failure, and acute renal failure.  PMH includes facial fx surgery.   Clinical Impression   Pt seen for OT evaluation this date in setting of acute hospitalization with COVID-19. Pt reports being completely INDEP at baseline including working and driving. On assessment, pt able to perform sup to sit with MOD I for increased time and HOB elevated. Pt on 6L at rest and sats 90-94%, with sup to sit, sats drop to 85-86%. Pt requires 2-3 minutes sitting to decrease WOB, but pt's spO2 only comes up to 87%. Titrated to 8L and still unable to bring O2 up over 90%. Requires titrate to 10L to come to >90% in sitting and with sitting activity. Pt requires SETUP and increased time to perform all UB/LB ADLs d/t decreased fxl activity tolerance, increased O2 needs. Unable to gather all ADL items d/t low energy reserve, but able to perform with pacing from seated position if items are brought to him. OT educates re: EC strategies, PLB technique, and postural exercises to increase surface area for quality of inhalation. Pt with good reception. Will continue to follow acutely and anticipate pt could benefit from pulmonary rehab f/u upon d/c from acute setting.     Follow Up Recommendations  Other (comment) (pulmonary rehab)    Equipment Recommendations  Tub/shower seat    Recommendations for Other Services       Precautions / Restrictions Precautions Precautions: Fall Restrictions Weight Bearing Restrictions: No      Mobility Bed Mobility Overal bed mobility: Modified  Independent             General bed mobility comments: increased time    Transfers Overall transfer level: Needs assistance Equipment used: None Transfers: Sit to/from Stand Sit to Stand: Min guard         General transfer comment: increased time d/t decreased fxl activity tolernance 2/2 O2 needs    Balance Overall balance assessment: Needs assistance Sitting-balance support: No upper extremity supported;Feet supported Sitting balance-Leahy Scale: Normal     Standing balance support: Single extremity supported;During functional activity Standing balance-Leahy Scale: Fair Standing balance comment: steady with at least unilateral support, F balance with no support.                           ADL either performed or assessed with clinical judgement   ADL Overall ADL's : Needs assistance/impaired                                       General ADL Comments: Pt requires SETUP and increased time to perform all UB/LB ADLs d/t decreased fxl activity tolerance, increased O2 needs. Unable to gather all ADL items d/t low energy reserve, but able to perform with pacing from seated position if items are brought to him.     Vision Patient Visual Report: No change from baseline       Perception     Praxis  Pertinent Vitals/Pain Pain Assessment: No/denies pain     Hand Dominance     Extremity/Trunk Assessment Upper Extremity Assessment Upper Extremity Assessment: Generalized weakness;Overall [facility] for tasks assessed   Lower Extremity Assessment Lower Extremity Assessment: Generalized weakness;Overall [facility] for tasks assessed   Cervical / Trunk Assessment Cervical / Trunk Assessment: Normal   Communication Communication Communication: No difficulties   Cognition Arousal/Alertness: Awake/alert Behavior During Therapy: WFL for tasks assessed/performed Overall Cognitive Status: Within Functional Limits for tasks assessed                                      General Comments  Pt on 6L at rest and safts 90-94%, with sup to sit, sats drop to 85-86%. Pt requires 2-3 minutes sitting to decrease WOB, but pt's spO2 only comes up to 87%. Titrated to 8L and still unable to bring O2 up over 90%. Requires titrate to 10L to come to >90% in sitting and with sitting activity.    Exercises Other Exercises Other Exercises: OT faciliates ed re: role of OT in acute setting, energy conservation, importance of posture/core strength, safety considerations, importance of OOB activity. Pt with good reception. Other Exercises: OT engages pt in 1 set x10 reps postural extension with scapular retraction with 2-3 second hold. Pt tolerates well. MIN cues for form/pace.   Shoulder Instructions      Home Living Family/patient expects to be discharged to:: Private residence Living Arrangements: Parent (pt's parents) Available Help at Discharge: Family;Available PRN/intermittently Type of Home: House Home Access: Stairs to enter [employer] of Steps: 2-3 Entrance Stairs-Rails: Right;Left;Can reach both Home Layout: Two level;Able to live on main level with bedroom/bathroom     Bathroom Shower/Tub: Tub/shower unit;Walk-in shower   Bathroom Toilet: Standard     Home Equipment: Grab bars - tub/shower          Prior Functioning/Environment Level of Independence: Independent        Comments: Works at [employer] 60 hours per week        OT Problem List: Decreased strength;Decreased activity tolerance;Cardiopulmonary status limiting activity      OT Treatment/Interventions: Self-care/ADL training;DME and/or AE instruction;Therapeutic activities;Therapeutic exercise;Energy conservation;Patient/family education    OT Goals(Current goals can be found in the care plan section) Acute Rehab OT Goals Patient Stated Goal: to go home OT Goal Formulation: With patient Time For Goal Achievement: 01/04/21 Potential to Achieve Goals:  Good ADL Goals Pt Will Perform Upper Body Dressing: with modified independence;standing (obtaining his personal clothing from his bag on counter and donning in standing to increased fxl tolerance) Pt Will Perform Lower Body Dressing: with modified independence;sit to/from stand Pt Will Transfer to Toilet: with modified independence;ambulating Pt Will Perform Toileting - Clothing Manipulation and hygiene: with modified independence;sit to/from stand Pt/caregiver will Perform Home Exercise Program: Increased strength;Both right and left upper extremity  OT Frequency: Min 1X/week   Barriers to D/C:            Co-evaluation              AM-PAC OT "6 Clicks" Daily Activity     Outcome Measure Help from another person eating meals?: None Help from another person taking care of personal grooming?: A Little Help from another person toileting, which includes using toliet, bedpan, or urinal?: A Little Help from another person bathing (including washing, rinsing, drying)?: A Little Help from another person to put on and  taking off regular upper body clothing?: None Help from another person to put on and taking off regular lower body clothing?: A Little 6 Click Score: 20   End of Session Equipment Utilized During Treatment: Oxygen Nurse Communication: Mobility status  Activity Tolerance: Patient tolerated treatment well Patient left: in bed;with call bell/phone within reach  OT Visit Diagnosis: Muscle weakness (generalized) (M62.81)                Time: 1287-8676 OT Time Calculation (min): 61 min Charges:  OT General Charges $OT Visit: 1 Visit OT Evaluation $OT Eval Low Complexity: 1 Low OT Treatments $Self Care/Home Management : 23-37 mins $Therapeutic Activity: 23-37 mins  Rejeana Brock, MS, OTR/L ascom [phone] 12/21/20, 4:41 PM

## 2020-12-21 NOTE — Progress Notes (Signed)
Patient ID: Luis Dyer, male   DOB: 02-12-1976, 45 y.o.   MRN: [id number] [facility] PROGRESS NOTE  Luis Dyer GXQ:[id number] DOB: [birth year] DOA: 12/13/2020 PCP: Patient, No Pcp Per  HPI/Subjective: Patient states he feels okay.  Does have a little chest discomfort when he coughs only but does not have it when he is not coughing.  Feels like his breathing is better than when he came in.  Admitted with acute hypoxic respiratory failure and COVID-19 pneumonia  Objective: Vitals:   12/21/20 0741 12/21/20 1304  BP: 101/65 109/76  Pulse: (!) 58 (!) 109  Resp: 18 18  Temp: 97.7 F (36.5 C) 97.8 F (36.6 C)  SpO2: 98% (!) 88%    Intake/Output Summary (Last 24 hours) at 12/21/2020 1418 Last data filed at 12/21/2020 1150 Gross per 24 hour  Intake 1093.19 ml  Output 2375 ml  Net -1281.81 ml   Filed Weights   12/13/20 1423 12/18/20 0537 12/19/20 0146  Weight: 85.3 kg 74.2 kg 90.9 kg    ROS: Review of Systems  Respiratory: Positive for cough and shortness of breath.   Cardiovascular: Negative for chest pain.  Gastrointestinal: Negative for abdominal pain, nausea and vomiting.   Exam: Physical Exam HENT:     Head: Normocephalic.     Mouth/Throat:     Pharynx: No oropharyngeal exudate.  Eyes:     General: Lids are normal.     Conjunctiva/sclera: Conjunctivae normal.  Cardiovascular:     Rate and Rhythm: Normal rate and regular rhythm.     Heart sounds: Normal heart sounds, S1 normal and S2 normal.  Pulmonary:     Breath sounds: Examination of the right-lower field reveals decreased breath sounds. Examination of the left-lower field reveals decreased breath sounds. Decreased breath sounds present. No wheezing, rhonchi or rales.  Abdominal:     Palpations: Abdomen is soft.     Tenderness: There is no abdominal tenderness.  Musculoskeletal:     Right lower leg: No swelling.     Left lower leg: No swelling.  Skin:    General: Skin is warm.     Findings:  No rash.  Neurological:     Mental Status: He is alert and oriented to person, place, and time.       Data Reviewed: Basic Metabolic Panel: Recent Labs  Lab 12/15/20 0508 12/20/20 0431 12/21/20 0447  NA 140 137 135  K 4.4 4.8 4.7  CL 106 101 101  CO2 23 28 27   GLUCOSE 196* 190* 210*  BUN 35* 45* 37*  CREATININE 1.38* 1.74* 1.47*  CALCIUM 8.7* 8.5* 8.3*   Liver Function Tests: Recent Labs  Lab 12/15/20 0508 12/21/20 0447  AST 85* 21  ALT 121* 75*  ALKPHOS 130* 101  BILITOT 0.7 1.0  PROT 6.9 5.9*  ALBUMIN 2.1* 2.2*   CBC: Recent Labs  Lab 12/15/20 0508 12/20/20 0431  WBC 18.5* 15.2*  NEUTROABS 16.7* 13.8*  HGB 14.7 16.4  HCT 42.0 48.3  MCV 89.9 92.4  PLT 410* 428*    CBG: Recent Labs  Lab 12/17/20 2030  GLUCAP 156*      Studies: 12/19/20 Venous Img Lower Bilateral (DVT)  Result Date: 12/20/2020 CLINICAL DATA:  Elevated D-dimer. Bilateral lower extremity pain. History of COVID-19 infection. Evaluate for DVT. EXAM: BILATERAL LOWER EXTREMITY VENOUS DOPPLER ULTRASOUND TECHNIQUE: Gray-scale sonography with graded compression, as well as color Doppler and duplex ultrasound were performed to evaluate the lower extremity deep venous systems from the  level of the common femoral vein and including the common femoral, femoral, profunda femoral, popliteal and calf veins including the posterior tibial, peroneal and gastrocnemius veins when visible. The superficial great saphenous vein was also interrogated. Spectral Doppler was utilized to evaluate flow at rest and with distal augmentation maneuvers in the common femoral, femoral and popliteal veins. COMPARISON:  None. FINDINGS: RIGHT LOWER EXTREMITY Common Femoral Vein: No evidence of thrombus. Normal compressibility, respiratory phasicity and response to augmentation. Saphenofemoral Junction: No evidence of thrombus. Normal compressibility and flow on color Doppler imaging. Profunda Femoral Vein: No evidence of thrombus.  Normal compressibility and flow on color Doppler imaging. Femoral Vein: No evidence of thrombus. Normal compressibility, respiratory phasicity and response to augmentation. Popliteal Vein: No evidence of thrombus. Normal compressibility, respiratory phasicity and response to augmentation. Calf Veins: No evidence of thrombus. Normal compressibility and flow on color Doppler imaging. Superficial Great Saphenous Vein: No evidence of thrombus. Normal compressibility. Venous Reflux:  None. Other Findings:  None. LEFT LOWER EXTREMITY Common Femoral Vein: No evidence of thrombus. Normal compressibility, respiratory phasicity and response to augmentation. Saphenofemoral Junction: No evidence of thrombus. Normal compressibility and flow on color Doppler imaging. Profunda Femoral Vein: No evidence of thrombus. Normal compressibility and flow on color Doppler imaging. Femoral Vein: No evidence of thrombus. Normal compressibility, respiratory phasicity and response to augmentation. Popliteal Vein: No evidence of thrombus. Normal compressibility, respiratory phasicity and response to augmentation. Calf Veins: No evidence of thrombus. Normal compressibility and flow on color Doppler imaging. Superficial Great Saphenous Vein: No evidence of thrombus. Normal compressibility. Venous Reflux:  None. Other Findings:  None. IMPRESSION: No evidence of DVT within either lower extremity. Electronically Signed   By: Simonne Come M.D.   On: 12/20/2020 16:13    Scheduled Meds: . [START ON 12/22/2020] baricitinib  4 mg Oral Daily  . benzonatate  100 mg Oral TID  . enoxaparin (LOVENOX) injection  40 mg Subcutaneous Q24H  . Ipratropium-Albuterol  1 puff Inhalation Q6H  . methylPREDNISolone (SOLU-MEDROL) injection  40 mg Intravenous Q12H   Continuous Infusions: . sodium chloride 50 mL/hr at 12/21/20 0544    Assessment/Plan:  1. Acute hypoxic respiratory failure secondary to COVID-19 pneumonia.  Initial pulse ox 87% on room air.   Patient this morning was on 6 L regular nasal cannula and dialed down to 5 L.  Pulse ox borderline 8889% and low 90s at times.  Patient completed remdesivir.  Continue baricitinib and Solu-Medrol.  Initial CT scan of the chest negative for pulmonary embolism.  Ultrasound of the lower extremity negative for DVT. 2. 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  Creatinine down to 1.47 today with IV fluid hydration. 3. Elevated fibrin derivatives but trending better.  Initial CT scan negative for PE and ultrasound of lower extremity negative for DVT. 4. Impaired fasting glucose.  Sugars elevated with steroids. 5. Weakness.  Physical therapy recommended home health PT.     Code Status:     Code Status Orders  (From admission, onward)         Start     Ordered   12/14/20 0058  Full code  Continuous        12/14/20 0057        Code Status History    This patient has a current code status but no historical code status.   Advance Care Planning Activity     Family Communication: Spoke with patient's sister on the phone Disposition Plan: Status is:  Inpatient  Dispo: The patient is from: Home              Anticipated d/c is to: Home              Anticipated d/c date is: Would like to see the patient down on 3 to 4 L oxygen at rest with good saturations prior to making a disposition.  Evaluate on a day-to-day basis at this point.              Patient currently slowly improving.  Now on 5 L regular nasal cannula but has borderline saturations.  Continue to watch inflammatory markers.  Time spent: 28 minutes  Luis Dyer [employer]

## 2020-12-21 NOTE — Progress Notes (Signed)
PHARMACY NOTE:  ANTIMICROBIAL RENAL DOSAGE ADJUSTMENT  Current antimicrobial regimen includes a mismatch between antimicrobial dosage and estimated renal function.  As per policy approved by the Pharmacy & Therapeutics and Medical Executive Committees, the antimicrobial dosage will be adjusted accordingly.  Current antimicrobial dosage:  baricitinib 2mg  qd  Indication: COVID PNA  Renal Function:  Est GFR > 60 ml/min  Estimated Creatinine Clearance: 71.1 mL/min (A) (by C-G formula based on SCr of 1.47 mg/dL (H)). []      On intermittent HD, scheduled: []      On CRRT    Antimicrobial dosage has been changed to:  baricitinib 4mg  qd  Additional comments:   Thank you for allowing pharmacy to be a part of this patient's care.  , PharmD, BCPS Clinical Pharmacist 12/21/2020 10:40 AM

## 2020-12-22 LAB — BASIC METABOLIC PANEL
Anion gap: 7 (ref 5–15)
BUN: 31 mg/dL — ABNORMAL HIGH (ref 6–20)
CO2: 28 mmol/L (ref 22–32)
Calcium: 8.1 mg/dL — ABNORMAL LOW (ref 8.9–10.3)
Chloride: 101 mmol/L (ref 98–111)
Creatinine, Ser: 1.24 mg/dL (ref 0.61–1.24)
GFR, Estimated: >60 mL/min (ref >60)
Glucose, Bld: 197 mg/dL — ABNORMAL HIGH (ref 70–99)
Potassium: 4.3 mmol/L (ref 3.5–5.1)
Sodium: 136 mmol/L (ref 135–145)

## 2020-12-22 LAB — FIBRIN DERIVATIVES D-DIMER (ARMC ONLY): Fibrin derivatives D-dimer (ARMC): 3666.35 ng/mL (FEU) — ABNORMAL HIGH (ref 0.00–499.00)

## 2020-12-22 NOTE — Progress Notes (Signed)
SATURATION QUALIFICATIONS: (This note is used to comply with regulatory documentation for home oxygen)  Patient Saturations on Room Air at Rest = 84% on room air on side of bed.  Patient Saturations on Room Air while Ambulating = patient moved to side of bed on RA, oxygen level is 80%.  Patient Saturations on Liters of oxygen while Ambulating = patient ambulated without oxygen registered 78-80% on RA. Applied oxygen to patient at 6L [place] and oxygen increased to 88%. Walked to front door and back to bed.  Please briefly explain why patient needs home oxygen: Patient was laying in bed. Moved to bedside. Oxygen level registered 84% RA. Ambulated to front door on RA, oxygen level registered 83%. Applied oxygen to 6L [place] and ambulated back to bed. When he sat back down on bed his O2 level registered 90%. Bed in low position and call bell in reach.

## 2020-12-22 NOTE — TOC Progression Note (Signed)
Transition of Care [facility]) - Progression Note    Patient Details  Name: Luis Dyer MRN: [id number] Date of Birth: 03/18/1976  [facility] of [facility]) CM/SW Contact  Delphia Grates, [place] Phone Number: 12/22/2020, 3:47 PM  Clinical Narrative:     CSW attempted to call [facility] for [facility] services since they are providing charity care this week for individuals without insurance. CSW left a voicemail.   Expected Discharge Plan: Home/Self Care Barriers to Discharge: Continued Medical Work up  Expected Discharge Plan and Services Expected Discharge Plan: Home/Self Care   Discharge Planning Services: CM Consult,Medication Assistance,Indigent Health Clinic   Living arrangements for the past 2 months: Single Family Home                                       Social Determinants of Health (SDOH) Interventions    Readmission Risk Interventions No flowsheet data found.

## 2020-12-22 NOTE — Progress Notes (Signed)
Patient ID: Luis Dyer, male   DOB: 30-Apr-1976, 45 y.o.   MRN: [id number] [facility] PROGRESS NOTE  Nicholus Chandran FYB:[id number] DOB: 10/09/1976 DOA: 12/13/2020 PCP: Patient, No Pcp Per  HPI/Subjective: Patient feels okay.  Offers no complaints.  Feels okay with standing up and moving around.  I dropped him down to 4 L nasal cannula today and stood him up and pulse ox dropped down into the low 80s.  Patient felt okay with exercising with low pulse ox.  Objective: Vitals:   12/22/20 0748 12/22/20 1131  BP: 101/71 110/78  Pulse: (!) 58 87  Resp: 14 14  Temp: 97.7 F (36.5 C) 97.7 F (36.5 C)  SpO2: 97% (!) 88%    Intake/Output Summary (Last 24 hours) at 12/22/2020 1433 Last data filed at 12/22/2020 0843 Gross per 24 hour  Intake 862.84 ml  Output 2600 ml  Net -1737.16 ml   Filed Weights   12/13/20 1423 12/18/20 0537 12/19/20 0146  Weight: 85.3 kg 74.2 kg 90.9 kg    ROS: Review of Systems  Respiratory: Positive for shortness of breath. Negative for cough.   Cardiovascular: Negative for chest pain.  Gastrointestinal: Negative for abdominal pain, nausea and vomiting.   Exam: Physical Exam HENT:     Head: Normocephalic.     Mouth/Throat:     Pharynx: No oropharyngeal exudate.  Eyes:     General: Lids are normal.     Conjunctiva/sclera: Conjunctivae normal.  Cardiovascular:     Rate and Rhythm: Normal rate and regular rhythm.     Heart sounds: Normal heart sounds, S1 normal and S2 normal.  Pulmonary:     Breath sounds: Examination of the right-lower field reveals decreased breath sounds. Examination of the left-lower field reveals decreased breath sounds. Decreased breath sounds present. No wheezing, rhonchi or rales.  Abdominal:     Palpations: Abdomen is soft.     Tenderness: There is no abdominal tenderness.  Musculoskeletal:     Right lower leg: No swelling.     Left lower leg: No swelling.  Skin:    General: Skin is warm.     Findings: No  rash.  Neurological:     Mental Status: He is alert and oriented to person, place, and time.       Data Reviewed: Basic Metabolic Panel: Recent Labs  Lab 12/20/20 0431 12/21/20 0447 12/22/20 0455  NA 137 135 136  K 4.8 4.7 4.3  CL 101 101 101  CO2 28 27 28   GLUCOSE 190* 210* 197*  BUN 45* 37* 31*  CREATININE 1.74* 1.47* 1.24  CALCIUM 8.5* 8.3* 8.1*   Liver Function Tests: Recent Labs  Lab 12/21/20 0447  AST 21  ALT 75*  ALKPHOS 101  BILITOT 1.0  PROT 5.9*  ALBUMIN 2.2*   CBC: Recent Labs  Lab 12/20/20 0431  WBC 15.2*  NEUTROABS 13.8*  HGB 16.4  HCT 48.3  MCV 92.4  PLT 428*    CBG: Recent Labs  Lab 12/17/20 2030  GLUCAP 156*    Studies: 12/19/20 Venous Img Lower Bilateral (DVT)  Result Date: 12/20/2020 CLINICAL DATA:  Elevated D-dimer. Bilateral lower extremity pain. History of COVID-19 infection. Evaluate for DVT. EXAM: BILATERAL LOWER EXTREMITY VENOUS DOPPLER ULTRASOUND TECHNIQUE: Gray-scale sonography with graded compression, as well as color Doppler and duplex ultrasound were performed to evaluate the lower extremity deep venous systems from the level of the common femoral vein and including the common femoral, femoral, profunda femoral, popliteal and  calf veins including the posterior tibial, peroneal and gastrocnemius veins when visible. The superficial great saphenous vein was also interrogated. Spectral Doppler was utilized to evaluate flow at rest and with distal augmentation maneuvers in the common femoral, femoral and popliteal veins. COMPARISON:  None. FINDINGS: RIGHT LOWER EXTREMITY Common Femoral Vein: No evidence of thrombus. Normal compressibility, respiratory phasicity and response to augmentation. Saphenofemoral Junction: No evidence of thrombus. Normal compressibility and flow on color Doppler imaging. Profunda Femoral Vein: No evidence of thrombus. Normal compressibility and flow on color Doppler imaging. Femoral Vein: No evidence of thrombus.  Normal compressibility, respiratory phasicity and response to augmentation. Popliteal Vein: No evidence of thrombus. Normal compressibility, respiratory phasicity and response to augmentation. Calf Veins: No evidence of thrombus. Normal compressibility and flow on color Doppler imaging. Superficial Great Saphenous Vein: No evidence of thrombus. Normal compressibility. Venous Reflux:  None. Other Findings:  None. LEFT LOWER EXTREMITY Common Femoral Vein: No evidence of thrombus. Normal compressibility, respiratory phasicity and response to augmentation. Saphenofemoral Junction: No evidence of thrombus. Normal compressibility and flow on color Doppler imaging. Profunda Femoral Vein: No evidence of thrombus. Normal compressibility and flow on color Doppler imaging. Femoral Vein: No evidence of thrombus. Normal compressibility, respiratory phasicity and response to augmentation. Popliteal Vein: No evidence of thrombus. Normal compressibility, respiratory phasicity and response to augmentation. Calf Veins: No evidence of thrombus. Normal compressibility and flow on color Doppler imaging. Superficial Great Saphenous Vein: No evidence of thrombus. Normal compressibility. Venous Reflux:  None. Other Findings:  None. IMPRESSION: No evidence of DVT within either lower extremity. Electronically Signed   By: Simonne Come M.D.   On: 12/20/2020 16:13    Scheduled Meds: . baricitinib  4 mg Oral Daily  . benzonatate  100 mg Oral TID  . enoxaparin (LOVENOX) injection  40 mg Subcutaneous Q24H  . Ipratropium-Albuterol  1 puff Inhalation Q6H  . methylPREDNISolone (SOLU-MEDROL) injection  40 mg Intravenous Q12H   Assessment/Plan:  1. Acute hypoxic respiratory failure secondary to COVID-19 pneumonia.  Initial pulse ox 87% on room air.  I tried dropping him down to 4 L regular nasal cannula and stood him up and walked him around and Pulse ox did drop down into the mid 80s.  Patient completed remdesivir.  Continue baricitinib  and Solu-Medrol. 2. Acute kidney injury on chronic kidney disease stage II.  Creatinine down to 1.24 today.  Was up at 1.85 on presentation and as high as 1.74 on 12/20/2020. 3. Elevated fibrin derivatives but trending better.  Initial CT scan of the chest negative for PE and ultrasound of the lower extremities negative for DVT.  Continue to trend. 4. Impaired fasting glucose.  Sugars elevated with steroids 5. Weakness.  Physical therapy recommends home health PT  Code Status:     Code Status Orders  (From admission, onward)         Start     Ordered   12/14/20 0058  Full code  Continuous        12/14/20 0057        Code Status History    This patient has a current code status but no historical code status.   Advance Care Planning Activity     Family Communication: Spoke with patient's sister on the phone Disposition Plan: Status is: Inpatient  Dispo: The patient is from: Home              Anticipated d/c is to: Home  Anticipated d/c date is: May end up taking a few more days here in the hospital.              Patient currently not medically stable with dropping his saturations in the mid 80s with ambulation   Difficult to place patient.  No patient will go home once stable  Time spent: 27 minutes  [employer]

## 2020-12-23 ENCOUNTER — Inpatient Hospital Stay: Payer: HRSA Program

## 2020-12-23 LAB — BASIC METABOLIC PANEL
Anion gap: 9 (ref 5–15)
BUN: 27 mg/dL — ABNORMAL HIGH (ref 6–20)
CO2: 31 mmol/L (ref 22–32)
Calcium: 8.7 mg/dL — ABNORMAL LOW (ref 8.9–10.3)
Chloride: 99 mmol/L (ref 98–111)
Creatinine, Ser: 1.16 mg/dL (ref 0.61–1.24)
GFR, Estimated: >60 mL/min (ref >60)
Glucose, Bld: 151 mg/dL — ABNORMAL HIGH (ref 70–99)
Potassium: 4.5 mmol/L (ref 3.5–5.1)
Sodium: 139 mmol/L (ref 135–145)

## 2020-12-23 LAB — FIBRIN DERIVATIVES D-DIMER (ARMC ONLY): Fibrin derivatives D-dimer (ARMC): 3698.33 ng/mL (FEU) — ABNORMAL HIGH (ref 0.00–499.00)

## 2020-12-23 MED ORDER — APIXABAN 5 MG PO TABS: 5.0000 mg | ORAL_TABLET | Freq: Two times a day (BID) | ORAL | Status: DC

## 2020-12-23 MED ORDER — SODIUM CHLORIDE 0.9 % IV SOLN: INTRAVENOUS | Status: DC

## 2020-12-23 MED ORDER — IOHEXOL 350 MG/ML SOLN
100.0000 mL | Freq: Once | INTRAVENOUS | Status: AC | PRN
  Administered 2020-12-23: 17:00:00 100 mL via INTRAVENOUS

## 2020-12-23 MED ORDER — APIXABAN 5 MG PO TABS
10.0000 mg | ORAL_TABLET | Freq: Two times a day (BID) | ORAL | Status: DC
  Administered 2020-12-23 – 2020-12-27 (×8): 10 mg via ORAL
  Filled 2020-12-23 (×5): qty 2
  Filled 2020-12-23: qty 4
  Filled 2020-12-23: qty 2
  Filled 2020-12-23: qty 4
  Filled 2020-12-23: qty 2

## 2020-12-23 NOTE — Consult Note (Signed)
ANTICOAGULATION CONSULT NOTE - Consult  Pharmacy Consult for Eliquis dosing Indication: Acute Bil Segmental PE  No Known Allergies  Patient Measurements: Height: 6' (182.9 cm) Weight: 90.9 kg (200 lb 4.8 oz) IBW/kg (Calculated) : 77.6   Vital Signs: Temp: 98.2 F (36.8 C) (01/23 1547) BP: 101/72 (01/23 1547) Pulse Rate: 100 (01/23 1547)  Labs: Recent Labs    12/21/20 0447 12/22/20 0455 12/23/20 0706  CREATININE 1.47* 1.24 1.16    Estimated Creatinine Clearance: 90.1 mL/min (by C-G formula based on SCr of 1.16 mg/dL).   Medications:  No AC or APT PTA. NKDA Inpatient Meds: Lovenox 40mg  q24h --> Eliquis VTE dosing.  Assessment: 45yo Male with acute hypoxic respiratory failure 2/2 covid found with new acute Bil Segmental PE on CT scan 1/23. Pharmacy consulted for transition lovenox to eliquis.  1/20: D-Dimer 9.91, H/H: 16.4/48.3, Plts: 428   Goal of Therapy:  Monitor platelets by anticoagulation protocol: Yes   Plan:  Stop lovenox 40mg  Q24h ppx now start eliquis 10mg  BID x7d now; followed by 5mg  BID thereafter. Will order CBC and monitor daily   04-22-1972 12/23/2020,5:56 PM

## 2020-12-23 NOTE — Progress Notes (Signed)
Patient ID: Luis Dyer, male   DOB: [birth year], 45 y.o.   MRN: [id number] [facility] PROGRESS NOTE  Shuaib Corsino APO:[id number] DOB: [birth year] DOA: 12/13/2020 PCP: Patient, No Pcp Per  HPI/Subjective: Having some intermittent chest pain when does cough.  Having some shortness of breath with moving around.  Otherwise patient feeling okay.  Even when he stands up he feels okay.  Still borderline saturations on 6 L nasal cannula.  Admitted with COVID-19 pneumonia.  Objective: Vitals:   12/23/20 0800 12/23/20 1144  BP: 94/65 109/74  Pulse: 67 (!) 106  Resp: 18 18  Temp: 98 F (36.7 C) 98 F (36.7 C)  SpO2: 96% 92%    Intake/Output Summary (Last 24 hours) at 12/23/2020 1517 Last data filed at 12/23/2020 0845 Gross per 24 hour  Intake --  Output 1800 ml  Net -1800 ml   Filed Weights   12/13/20 1423 12/18/20 0537 12/19/20 0146  Weight: 85.3 kg 74.2 kg 90.9 kg    ROS: Review of Systems  Respiratory: Positive for shortness of breath.   Cardiovascular: Positive for chest pain.  Gastrointestinal: Negative for abdominal pain, nausea and vomiting.   Exam: Physical Exam HENT:     Head: Normocephalic.     Mouth/Throat:     Pharynx: No oropharyngeal exudate.  Eyes:     General: Lids are normal.  Cardiovascular:     Rate and Rhythm: Normal rate and regular rhythm.     Heart sounds: Normal heart sounds, S1 normal and S2 normal.  Pulmonary:     Breath sounds: Examination of the right-lower field reveals decreased breath sounds. Examination of the left-lower field reveals decreased breath sounds. Decreased breath sounds present. No wheezing, rhonchi or rales.  Abdominal:     Palpations: Abdomen is soft.     Tenderness: There is no abdominal tenderness.  Musculoskeletal:     Right lower leg: No swelling.     Left lower leg: No swelling.  Skin:    General: Skin is warm.     Findings: No rash.  Neurological:     Mental Status: He is alert and oriented to  person, place, and time.       Data Reviewed: Basic Metabolic Panel: Recent Labs  Lab 12/20/20 0431 12/21/20 0447 12/22/20 0455 12/23/20 0706  NA 137 135 136 139  K 4.8 4.7 4.3 4.5  CL 101 101 101 99  CO2 28 27 28 31   GLUCOSE 190* 210* 197* 151*  BUN 45* 37* 31* 27*  CREATININE 1.74* 1.47* 1.24 1.16  CALCIUM 8.5* 8.3* 8.1* 8.7*   Liver Function Tests: Recent Labs  Lab 12/21/20 0447  AST 21  ALT 75*  ALKPHOS 101  BILITOT 1.0  PROT 5.9*  ALBUMIN 2.2*   CBC: Recent Labs  Lab 12/20/20 0431  WBC 15.2*  NEUTROABS 13.8*  HGB 16.4  HCT 48.3  MCV 92.4  PLT 428*    CBG: Recent Labs  Lab 12/17/20 2030  GLUCAP 156*    Scheduled Meds: . baricitinib  4 mg Oral Daily  . benzonatate  100 mg Oral TID  . enoxaparin (LOVENOX) injection  40 mg Subcutaneous Q24H  . Ipratropium-Albuterol  1 puff Inhalation Q6H  . methylPREDNISolone (SOLU-MEDROL) injection  40 mg Intravenous Q12H   Continuous Infusions: . sodium chloride      Assessment/Plan:  1. Acute hypoxic respiratory failure secondary to COVID-19 pneumonia.  Patient had initial pulse ox of 87% on room air.  Today patient still  on 6 L nasal cannula with borderline saturations.  Continue baricitinib and Solu-Medrol.  Completed remdesivir course.  Advised to continue to try to ambulate. 2. Elevated fibrin derivatives but have leveled off.  Patient having some chest discomfort and and having we will repeat a CT scan of the chest to make sure he does not have a blood clot. 3. Acute kidney injury on chronic kidney disease stage II.  Creatinine has improved down to 1.16.  We will give IV fluids since getting a repeat CAT scan. 4. Impaired fasting glucose.  Hemoglobin A1c 6.0.  Sugars elevated secondary to steroids 5. Weakness.  Physical therapy recommends home health PT        Code Status:     Code Status Orders  (From admission, onward)         Start     Ordered   12/14/20 0058  Full code  Continuous         12/14/20 0057        Code Status History    This patient has a current code status but no historical code status.   Advance Care Planning Activity     Family Communication: Left message for patient's sister on the phone Disposition Plan: Status is: Inpatient  Dispo: The patient is from: Home              Anticipated d/c is to: Home              Anticipated d/c date is: May end up needing another 4 days here in the hospital              Patient currently still hypoxic on 6 L of oxygen   Difficult to place patient.  No, will be able to go home with home  Time spent: 26 minutes  [employer]

## 2020-12-24 DIAGNOSIS — I2699 Other pulmonary embolism without acute cor pulmonale: Secondary | ICD-10-CM

## 2020-12-24 DIAGNOSIS — I2694 Multiple subsegmental pulmonary emboli without acute cor pulmonale: Secondary | ICD-10-CM

## 2020-12-24 LAB — BASIC METABOLIC PANEL
Anion gap: 9 (ref 5–15)
BUN: 29 mg/dL — ABNORMAL HIGH (ref 6–20)
CO2: 29 mmol/L (ref 22–32)
Calcium: 8.3 mg/dL — ABNORMAL LOW (ref 8.9–10.3)
Chloride: 100 mmol/L (ref 98–111)
Creatinine, Ser: 1.27 mg/dL — ABNORMAL HIGH (ref 0.61–1.24)
GFR, Estimated: >60 mL/min (ref >60)
Glucose, Bld: 247 mg/dL — ABNORMAL HIGH (ref 70–99)
Potassium: 4.5 mmol/L (ref 3.5–5.1)
Sodium: 138 mmol/L (ref 135–145)

## 2020-12-24 LAB — CBC
HCT: 41.7 % (ref 39.0–52.0)
Hemoglobin: 14.1 g/dL (ref 13.0–17.0)
MCH: 31.4 pg (ref 26.0–34.0)
MCHC: 33.8 g/dL (ref 30.0–36.0)
MCV: 92.9 fL (ref 80.0–100.0)
Platelets: 402 10*3/uL — ABNORMAL HIGH (ref 150–400)
RBC: 4.49 MIL/uL (ref 4.22–5.81)
RDW: 13.5 % (ref 11.5–15.5)
WBC: 13.4 10*3/uL — ABNORMAL HIGH (ref 4.0–10.5)
nRBC: 0 % (ref 0.0–0.2)

## 2020-12-24 LAB — FIBRIN DERIVATIVES D-DIMER (ARMC ONLY): Fibrin derivatives D-dimer (ARMC): 2753.98 ng/mL (FEU) — ABNORMAL HIGH (ref 0.00–499.00)

## 2020-12-24 LAB — GLUCOSE, CAPILLARY: Glucose-Capillary: 203 mg/dL — ABNORMAL HIGH (ref 70–99)

## 2020-12-24 MED ORDER — INSULIN ASPART 100 UNIT/ML ~~LOC~~ SOLN
0.0000 [IU] | Freq: Three times a day (TID) | SUBCUTANEOUS | Status: DC
  Administered 2020-12-25: 18:00:00 2 [IU] via SUBCUTANEOUS
  Administered 2020-12-26: 18:00:00 3 [IU] via SUBCUTANEOUS
  Filled 2020-12-24: qty 1

## 2020-12-24 MED ORDER — PREDNISONE 20 MG PO TABS
40.0000 mg | ORAL_TABLET | Freq: Every day | ORAL | Status: DC
  Administered 2020-12-25 – 2020-12-27 (×3): 40 mg via ORAL
  Filled 2020-12-24 (×3): qty 2

## 2020-12-24 MED ORDER — INSULIN ASPART 100 UNIT/ML ~~LOC~~ SOLN
0.0000 [IU] | Freq: Every day | SUBCUTANEOUS | Status: DC
  Administered 2020-12-24 – 2020-12-26 (×3): 2 [IU] via SUBCUTANEOUS
  Filled 2020-12-24 (×3): qty 1

## 2020-12-24 NOTE — TOC Progression Note (Signed)
Transition of Care [facility]) - Progression Note    Patient Details  Name: Luis Dyer MRN: [id number] Date of Birth: Apr 08, 1976  [facility] CM/SW Contact  Allayne Butcher, RN Phone Number: 12/24/2020, 4:07 PM  Clinical Narrative:    [facility] referral given to [place] with [facility] for [facility] PT.    Expected Discharge Plan: Home/Self Care Barriers to Discharge: Continued Medical Work up  Expected Discharge Plan and Services Expected Discharge Plan: Home/Self Care   Discharge Planning Services: CM Consult,Medication Assistance,Indigent Health Clinic Post Acute Care Choice: Home Health Living arrangements for the past 2 months: Single Family Home                           HH Arranged: PT [facility] Agency: [facility] Date [facility] Agency Contacted: 12/24/20 Time HH Agency Contacted: [phone] Representative spoke with at [facility] Agency: [place]   Social Determinants of Health (SDOH) Interventions    Readmission Risk Interventions No flowsheet data found.

## 2020-12-24 NOTE — Progress Notes (Signed)
Patient ID: Luis Dyer, male   DOB: [birth year], 45 y.o.   MRN: [id number] [facility] PROGRESS NOTE  Luis Dyer YEM:[id number] DOB: Aug 19, 1976 DOA: 12/13/2020 PCP: Patient, No Pcp Per  HPI/Subjective: Patient feels better since I started blood thinner last night.  Not having any chest pain.  Still has a little shortness of breath.  Admitted with COVID-19 pneumonia and acute hypoxic respiratory failure.  Objective: Vitals:   12/24/20 1109 12/24/20 1514  BP: 103/68 115/80  Pulse: 93 97  Resp: 17 14  Temp: 98.2 F (36.8 C) 97.9 F (36.6 C)  SpO2: 90% 93%    Intake/Output Summary (Last 24 hours) at 12/24/2020 1536 Last data filed at 12/24/2020 0500 Gross per 24 hour  Intake 512.5 ml  Output 1000 ml  Net -487.5 ml   Filed Weights   12/13/20 1423 12/18/20 0537 12/19/20 0146  Weight: 85.3 kg 74.2 kg 90.9 kg    ROS: Review of Systems  Respiratory: Positive for shortness of breath. Negative for cough.   Cardiovascular: Negative for chest pain.  Gastrointestinal: Negative for abdominal pain, nausea and vomiting.   Exam: Physical Exam HENT:     Head: Normocephalic.     Mouth/Throat:     Pharynx: No oropharyngeal exudate.  Eyes:     General: Lids are normal.     Conjunctiva/sclera: Conjunctivae normal.  Cardiovascular:     Rate and Rhythm: Regular rhythm. Tachycardia present.     Heart sounds: Normal heart sounds, S1 normal and S2 normal.  Pulmonary:     Breath sounds: Examination of the right-lower field reveals decreased breath sounds. Examination of the left-lower field reveals decreased breath sounds. Decreased breath sounds present. No wheezing, rhonchi or rales.  Abdominal:     Palpations: Abdomen is soft.     Tenderness: There is no abdominal tenderness.  Musculoskeletal:     Right lower leg: No swelling.     Left lower leg: No swelling.  Skin:    General: Skin is warm.     Findings: No rash.  Neurological:     Mental Status: He is alert and  oriented to person, place, and time.       Data Reviewed: Basic Metabolic Panel: Recent Labs  Lab 12/20/20 0431 12/21/20 0447 12/22/20 0455 12/23/20 0706 12/24/20 0527  NA 137 135 136 139 138  K 4.8 4.7 4.3 4.5 4.5  CL 101 101 101 99 100  CO2 28 27 28 31 29   GLUCOSE 190* 210* 197* 151* 247*  BUN 45* 37* 31* 27* 29*  CREATININE 1.74* 1.47* 1.24 1.16 1.27*  CALCIUM 8.5* 8.3* 8.1* 8.7* 8.3*   Liver Function Tests: Recent Labs  Lab 12/21/20 0447  AST 21  ALT 75*  ALKPHOS 101  BILITOT 1.0  PROT 5.9*  ALBUMIN 2.2*   CBC: Recent Labs  Lab 12/20/20 0431 12/24/20 0527  WBC 15.2* 13.4*  NEUTROABS 13.8*  --   HGB 16.4 14.1  HCT 48.3 41.7  MCV 92.4 92.9  PLT 428* 402*   CBG: Recent Labs  Lab 12/17/20 2030  GLUCAP 156*    Studies: CT ANGIO CHEST PE W OR WO CONTRAST  Result Date: 12/23/2020 CLINICAL DATA:  Increased shortness of breath, COVID-19 positive, positive D dimer EXAM: CT ANGIOGRAPHY CHEST WITH CONTRAST TECHNIQUE: Multidetector CT imaging of the chest was performed using the standard protocol during bolus administration of intravenous contrast. Multiplanar CT image reconstructions and MIPs were obtained to evaluate the vascular anatomy. CONTRAST:  12/25/2020  OMNIPAQUE IOHEXOL 350 MG/ML SOLN COMPARISON:  12/14/2020 FINDINGS: Cardiovascular: This is a technically adequate evaluation of the pulmonary vasculature. Small segmental emboli are seen within the right upper, right middle, and left lower lobes. Clot burden is minimal, with no evidence of right heart strain. The heart is unremarkable without pericardial effusion. No evidence of thoracic aortic aneurysm or dissection. Mediastinum/Nodes: No enlarged mediastinal, hilar, or axillary lymph nodes. Thyroid gland, trachea, and esophagus demonstrate no significant findings. Lungs/Pleura: The multifocal bilateral airspace disease seen previously is again identified, with slight improvement in the interim. No effusion or  pneumothorax. Central airways are patent. Upper Abdomen: No acute abnormality. Musculoskeletal: No acute or destructive bony lesions. Reconstructed images demonstrate no additional findings. Review of the MIP images confirms the above findings. IMPRESSION: 1. Bilateral segmental pulmonary emboli, with minimal clot burden. No evidence of right heart strain. 2. Slight interval improvement in the multifocal bilateral COVID-19 pneumonia. Critical Value/emergent results were called by telephone at the time of interpretation on 12/23/2020 at 5:34 pm to provider Alford Highland , who verbally acknowledged these results. Electronically Signed   By: Sharlet Salina M.D.   On: 12/23/2020 17:34    Scheduled Meds: . apixaban  10 mg Oral BID   Followed by  . [START ON 12/30/2020] apixaban  5 mg Oral BID  . baricitinib  4 mg Oral Daily  . benzonatate  100 mg Oral TID  . insulin aspart  0-5 Units Subcutaneous QHS  . insulin aspart  0-9 Units Subcutaneous TID WC  . Ipratropium-Albuterol  1 puff Inhalation Q6H  . [START ON 12/25/2020] predniSONE  40 mg Oral Q breakfast   Continuous Infusions: . sodium chloride 50 mL/hr at 12/24/20 1242    Assessment/Plan:  1. Acute hypoxic respiratory failure secondary to COVID-19 pneumonia.  Initial pulse ox was 87% on room air.  Today we were able to get him down to 4 L but when he stood up and started walking, He did drop his saturations into the mid 80s.  Continue to ambulate.  Change to prednisone for tomorrow.  Continue baricitinib.  Finished remdesivir.  Daily trying to taper down oxygen.  Likely will have to send home with oxygen.  Hoping to get down to 2 L at rest and 4 L with exercise. 2. Acute bilateral pulmonary emboli seen on CT scan last night.  Started Eliquis.  Since the patient does not have insurance will have to look into what medication management has whether it is Eliquis or Xarelto.  Patient does have tachycardia with standing up.  Would rather not treat this  at this point 3. Acute kidney injury on chronic kidney disease stage II.  We will give IV fluids since patient had a CAT scan last night.  Creatinine 1.27 today.  Continue to monitor. 4. Impaired fasting glucose.  Hemoglobin A1c 6.0.  Patient is not a diabetic.  Sugars elevated secondary to steroids.  Sliding scale for now. 5. Weakness.  Physical therapy recommends home health PT        Code Status:     Code Status Orders  (From admission, onward)         Start     Ordered   12/14/20 0058  Full code  Continuous        12/14/20 0057        Code Status History    This patient has a current code status but no historical code status.   Advance Care Planning Activity  Family Communication: Spoke with the patient's Sister Kathie Rhodes on the phone Disposition Plan: Status is: Inpatient  Dispo: The patient is from: Home              Anticipated d/c is to: Home              Anticipated d/c date is: Once patient able to hold his saturations with ambulation.              Patient currently started on blood thinner last night for bilateral pulmonary emboli.   Difficult to place patient.  No, patient will go home with home health.  Time spent: 26 minutes  [employer]

## 2020-12-24 NOTE — Progress Notes (Signed)
Patient desat to 77% on 2L while ambulating from room 106 to 113. Bo Mcclintock, RN

## 2020-12-24 NOTE — Progress Notes (Signed)
Occupational Therapy Treatment Patient Details Name: Luis Dyer MRN: [id number] DOB: [birth year] Today's Date: 12/24/2020    History of present illness Pt is a 45 y.o. male presenting to hospital 1/13 with L sided chest pain and SOB (gradual onset and worsening over past 3-4 days); (+) COVID 1/7.  Pt seen in ED 1 week prior for gastroenteritis and dehydration.  Pt admitted with COVID-19 PNA, chest pain, acute hypoxic respiratory failure, and acute renal failure.  PMH includes facial fx surgery.   OT comments  Pt seen for OT evaluation this date to f/u re: tolerance for ADLs/ADL mobility. OT facilitates pt fxl mobility to restroom and use of restroom to void with SBA initially to manage IV pole and lines, but ultimately, pt completes with MOD I. Pt demos increased tolerance for fxl mobility this date. OT faciliattes ed re: HEP for core/trunk to increase surface area for breathing. OT uses med bridge to complie appropriate core exercise including straight arm raise with truncal twist, scapular retraction, and contralateral reaching in supine or sitting. Pt with good understanding and handout utilized. Pt overall progressing. Note: pt on 2Lnc this date, attempted mobility on RA and pt de-sats to 84-85%, able to sustain 88-92% on 2Lnc, up to 3L with activity to sustain >90%. RN notified. Continue to rec pulmonary rehab as most appropriate f/u upon d/c from acute stay.   Follow Up Recommendations  Other (comment) (pulmonary rehab)    Equipment Recommendations  Tub/shower seat    Recommendations for Other Services      Precautions / Restrictions Precautions Precautions: Fall Restrictions Weight Bearing Restrictions: No       Mobility Bed Mobility Overal bed mobility: Modified Independent                Transfers Overall transfer level: Modified independent Equipment used: None Transfers: Sit to/from Stand Sit to Stand: Modified independent (Device/Increase time)          General transfer comment: O2 assists managing IV and O2 initially, but pt able to mobilize IV/monitor once OT unplugs from wall. Pt with good control and safety awareness.    Balance Overall balance assessment: Modified Independent                                         ADL either performed or assessed with clinical judgement   ADL Overall ADL's : Modified independent;At baseline                     Lower Body Dressing: Modified independent   Toilet Transfer: Modified Independent   Toileting- Clothing Manipulation and Hygiene: Modified independent       Functional mobility during ADLs: Modified independent (to/from restroom with IV pole, pt demos good balance, increased tolerance)       Vision Patient Visual Report: No change from baseline     Perception     Praxis      Cognition Arousal/Alertness: Awake/alert Behavior During Therapy: WFL for tasks assessed/performed Overall Cognitive Status: Within Functional Limits for tasks assessed                                          Exercises Other Exercises Other Exercises: OT faciliattes ed re: HEP for core/trunk to increase surface area for breathing. OT  uses med bridge to complie appropriate core exercise including straight arm raise with truncal twist, scapular retraction, and contralateral reaching in supine or sitting. Pt with good understanding and handout utilized.   Shoulder Instructions       General Comments pt on 2Lnc this date, attempted mobility on RA and pt de-sats to 84-85%, able to sustain 88-92% on 2Lnc, up to 3L with activity to sustain >90%.    Pertinent Vitals/ Pain       Pain Assessment: No/denies pain  Home Living                                          Prior Functioning/Environment              Frequency  Min 1X/week        Progress Toward Goals  OT Goals(current goals can now be found in the care plan section)   Progress towards OT goals: Progressing toward goals  Acute Rehab OT Goals Patient Stated Goal: to go home OT Goal Formulation: With patient Time For Goal Achievement: 01/04/21 Potential to Achieve Goals: Good  Plan      Co-evaluation                 AM-PAC OT "6 Clicks" Daily Activity     Outcome Measure   Help from another person eating meals?: None Help from another person taking care of personal grooming?: None Help from another person toileting, which includes using toliet, bedpan, or urinal?: None Help from another person bathing (including washing, rinsing, drying)?: A Little Help from another person to put on and taking off regular upper body clothing?: None Help from another person to put on and taking off regular lower body clothing?: None 6 Click Score: 23    End of Session Equipment Utilized During Treatment: Oxygen  OT Visit Diagnosis: Muscle weakness (generalized) (M62.81)   Activity Tolerance Patient tolerated treatment well   Patient Left in bed;with call bell/phone within reach   Nurse Communication Mobility status        Time: 7989-2119 OT Time Calculation (min): 23 min  Charges: OT General Charges $OT Visit: 1 Visit OT Treatments $Self Care/Home Management : 8-22 mins $Therapeutic Exercise: 8-22 mins  Rejeana Brock, MS, OTR/L ascom [phone] 12/24/20, 3:19 PM

## 2020-12-24 NOTE — Progress Notes (Signed)
PT Cancellation Note  Patient Details Name: Luis Dyer MRN: [id number] DOB: Mar 07, 1976   Cancelled Treatment:    Reason Eval/Treat Not Completed: Medical issues which prohibited therapy   New PE's noted.  Will hold therapy per protocol and continue as appropriate.   [name] 12/24/2020, 8:14 AM

## 2020-12-25 DIAGNOSIS — I2609 Other pulmonary embolism with acute cor pulmonale: Secondary | ICD-10-CM

## 2020-12-25 LAB — GLUCOSE, CAPILLARY
Glucose-Capillary: 94 mg/dL (ref 70–99)
Glucose-Capillary: 110 mg/dL — ABNORMAL HIGH (ref 70–99)
Glucose-Capillary: 187 mg/dL — ABNORMAL HIGH (ref 70–99)
Glucose-Capillary: 236 mg/dL — ABNORMAL HIGH (ref 70–99)

## 2020-12-25 LAB — BASIC METABOLIC PANEL
Anion gap: 10 (ref 5–15)
BUN: 25 mg/dL — ABNORMAL HIGH (ref 6–20)
CO2: 28 mmol/L (ref 22–32)
Calcium: 8 mg/dL — ABNORMAL LOW (ref 8.9–10.3)
Chloride: 102 mmol/L (ref 98–111)
Creatinine, Ser: 1.33 mg/dL — ABNORMAL HIGH (ref 0.61–1.24)
GFR, Estimated: >60 mL/min (ref >60)
Glucose, Bld: 131 mg/dL — ABNORMAL HIGH (ref 70–99)
Potassium: 3.6 mmol/L (ref 3.5–5.1)
Sodium: 140 mmol/L (ref 135–145)

## 2020-12-25 MED ORDER — SODIUM CHLORIDE 0.9 % IV SOLN: INTRAVENOUS | Status: DC

## 2020-12-25 MED ORDER — DILTIAZEM HCL ER COATED BEADS 120 MG PO CP24
120.0000 mg | ORAL_CAPSULE | Freq: Every day | ORAL | Status: DC
  Administered 2020-12-25 – 2020-12-26 (×2): 120 mg via ORAL
  Filled 2020-12-25 (×3): qty 1

## 2020-12-25 NOTE — Progress Notes (Signed)
Patient ID: Luis Dyer, male   DOB: 1976/03/12, 45 y.o.   MRN: [id number] Luis Dyer PROGRESS NOTE  Luis Dyer [id number]:[id number] DOB: December 09, 1975 DOA: 12/13/2020 PCP: Patient, No Pcp Per  HPI/Subjective: Patient feeling well.  Ambulated with me and also with nursing staff in the hallway and that saturations still dropping down into the 80s.  Has some shortness of breath.  Some cough.  Objective: Vitals:   12/25/20 1212 12/25/20 1254  BP: 106/70   Pulse: 94   Resp: 16   Temp: 98.3 F (36.8 C)   SpO2: 94% (!) 80%   No intake or output data in the 24 hours ending 12/25/20 1706 Filed Weights   12/13/20 1423 12/18/20 0537 12/19/20 0146  Weight: 85.3 kg 74.2 kg 90.9 kg    ROS: ROS Exam: Physical Exam HENT:     Head: Normocephalic.     Mouth/Throat:     Pharynx: No oropharyngeal exudate.  Eyes:     General: Lids are normal.     Conjunctiva/sclera: Conjunctivae normal.  Cardiovascular:     Rate and Rhythm: Regular rhythm. Tachycardia present.     Heart sounds: Normal heart sounds, S1 normal and S2 normal.     Comments: Tachycardic with standing and ambulating Pulmonary:     Breath sounds: Examination of the right-lower field reveals decreased breath sounds. Examination of the left-lower field reveals decreased breath sounds. Decreased breath sounds present. No wheezing, rhonchi or rales.  Abdominal:     Palpations: Abdomen is soft.     Tenderness: There is no abdominal tenderness.  Musculoskeletal:     Right ankle: No swelling.     Left ankle: No swelling.  Skin:    General: Skin is warm.     Findings: No rash.  Neurological:     Mental Status: He is alert and oriented to person, place, and time.       Data Reviewed: Basic Metabolic Panel: Recent Labs  Lab 12/21/20 0447 12/22/20 0455 12/23/20 0706 12/24/20 0527 12/25/20 0630  NA 135 136 139 138 140  K 4.7 4.3 4.5 4.5 3.6  CL 101 101 99 100 102  CO2 27 28 31 29 28   GLUCOSE 210* 197* 151*  247* 131*  BUN 37* 31* 27* 29* 25*  CREATININE 1.47* 1.24 1.16 1.27* 1.33*  CALCIUM 8.3* 8.1* 8.7* 8.3* 8.0*   Liver Function Tests: Recent Labs  Lab 12/21/20 0447  AST 21  ALT 75*  ALKPHOS 101  BILITOT 1.0  PROT 5.9*  ALBUMIN 2.2*   CBC: Recent Labs  Lab 12/20/20 0431 12/24/20 0527  WBC 15.2* 13.4*  NEUTROABS 13.8*  --   HGB 16.4 14.1  HCT 48.3 41.7  MCV 92.4 92.9  PLT 428* 402*    CBG: Recent Labs  Lab 12/24/20 2121 12/25/20 0831 12/25/20 1213  GLUCAP 203* 94 110*     Studies: CT ANGIO CHEST PE W OR WO CONTRAST  Result Date: 12/23/2020 CLINICAL DATA:  Increased shortness of breath, COVID-19 positive, positive D dimer EXAM: CT ANGIOGRAPHY CHEST WITH CONTRAST TECHNIQUE: Multidetector CT imaging of the chest was performed using the standard protocol during bolus administration of intravenous contrast. Multiplanar CT image reconstructions and MIPs were obtained to evaluate the vascular anatomy. CONTRAST:  12/25/2020 OMNIPAQUE IOHEXOL 350 MG/ML SOLN COMPARISON:  12/14/2020 FINDINGS: Cardiovascular: This is a technically adequate evaluation of the pulmonary vasculature. Small segmental emboli are seen within the right upper, right middle, and left lower lobes. Clot burden is minimal,  with no evidence of right heart strain. The heart is unremarkable without pericardial effusion. No evidence of thoracic aortic aneurysm or dissection. Mediastinum/Nodes: No enlarged mediastinal, hilar, or axillary lymph nodes. Thyroid gland, trachea, and esophagus demonstrate no significant findings. Lungs/Pleura: The multifocal bilateral airspace disease seen previously is again identified, with slight improvement in the interim. No effusion or pneumothorax. Central airways are patent. Upper Abdomen: No acute abnormality. Musculoskeletal: No acute or destructive bony lesions. Reconstructed images demonstrate no additional findings. Review of the MIP images confirms the above findings. IMPRESSION: 1.  Bilateral segmental pulmonary emboli, with minimal clot burden. No evidence of right heart strain. 2. Slight interval improvement in the multifocal bilateral COVID-19 pneumonia. Critical Value/emergent results were called by telephone at the time of interpretation on 12/23/2020 at 5:34 pm to provider Luis Dyer , who verbally acknowledged these results. Electronically Signed   By: Luis Dyer M.D.   On: 12/23/2020 17:34    Scheduled Meds: . apixaban  10 mg Oral BID   Followed by  . [START ON 12/30/2020] apixaban  5 mg Oral BID  . baricitinib  4 mg Oral Daily  . benzonatate  100 mg Oral TID  . diltiazem  120 mg Oral Daily  . insulin aspart  0-5 Units Subcutaneous QHS  . insulin aspart  0-9 Units Subcutaneous TID WC  . Ipratropium-Albuterol  1 puff Inhalation Q6H  . predniSONE  40 mg Oral Q breakfast   Brief history.  Patient admitted with acute hypoxic respiratory failure secondary to COVID-19 pneumonia.  Patient's been in the hospital 12 days.  On baricitinib.  On prednisone.  Completed remdesivir.  Patient was on salter high flow nasal cannula most of the hospital stay and finally titrated down to 2.5 L at rest and still desaturating with ambulation.  Repeat CT scan showed bilateral pulmonary embolism and patient was started on Eliquis.  Also added a little low-dose Cardizem CD for tachycardia with standing up.  Continue to evaluate on a daily basis on when to go home.  Would like the patient to hold his saturations above 88% on 4 L with ambulation prior to disposition. Assessment/Plan:  1. Acute hypoxic respiratory failure secondary to COVID-19 pneumonia.  Initial pulse ox 87% on room air.  He was down to 2.5 L at rest.  But when I started to ambulate him even on 4 L he did drop his saturation in the mid 80s.  Patient also became tachycardic with heart rate in the 150s.  Patient on prednisone, baricitinib and completed remdesivir. 2. Acute bilateral pulmonary embolism on CT scan.  Continue  Eliquis.  Will need medications through medication management whether this is Eliquis or Xarelto.  Patient does have tachycardia with walking.  Will start low-dose Cardizem. 3. Acute kidney injury on chronic kidney disease stage II.  Will restart IV fluids since creatinine up at 1.33.  Recheck BMP tomorrow 4. Impaired fasting glucose.  Hemoglobin A1c 6.0.  Patient not a diabetic.  Sugars elevated secondary to steroids. 5. Weakness.  Physical therapy recommends home with home health.  Patient walked around the nursing station with me fine today except for being hypoxic.      Code Status:     Code Status Orders  (From admission, onward)         Start     Ordered   12/14/20 0058  Full code  Continuous        12/14/20 0057        Code Status History  This patient has a current code status but no historical code status.   Advance Care Planning Activity     Family Communication: Spoke with sister on the phone Disposition Plan: Status is: Inpatient  Dispo: The patient is from: Home              Anticipated d/c is to: Home with home health              Anticipated d/c date is: Once patient is able to hold his saturations with ambulation on 4 L can go home.              Patient currently not ready to go home.  Dropped saturations to 80% with walking on 4 L.   Difficult to place patient.  No because he will be able to go home  Time spent: 29 minutes  Korrie [employer]

## 2020-12-25 NOTE — TOC Progression Note (Signed)
Transition of Care [facility]) - Progression Note    Patient Details  Name: Luis Dyer MRN: [id number] Date of Birth: 07-Jun-1976  [facility] CM/SW Contact  Allayne Butcher, RN Phone Number: 12/25/2020, 3:26 PM  Clinical Narrative:    Patient is not medically stable for discharge home today.  Patient is still dropping into the low 80's for oxygenation while walking with oxygen on.  TOC provided another copy of the [facility] and [facility] to patient today- said he lost the other one when moved to different room.  [facility] did accept charity referral for HHPT but the patient is not sure he wants HH, he wants to speak with his sister before he decides.   TOC will cont to follow- patient will need O2 at discharge.    Expected Discharge Plan: Home/Self Care Barriers to Discharge: Continued Medical Work up  Expected Discharge Plan and Services Expected Discharge Plan: Home/Self Care   Discharge Planning Services: CM Consult,Medication Assistance,Indigent Health Clinic Post Acute Care Choice: Home Health Living arrangements for the past 2 months: Single Family Home                           HH Arranged: PT [facility] Agency: [facility] Date [facility] Agency Contacted: 12/24/20 Time HH Agency Contacted: 1607 Representative spoke with at [facility] Agency: [place]   Social Determinants of Health (SDOH) Interventions    Readmission Risk Interventions No flowsheet data found.

## 2020-12-26 LAB — GLUCOSE, CAPILLARY
Glucose-Capillary: 114 mg/dL — ABNORMAL HIGH (ref 70–99)
Glucose-Capillary: 105 mg/dL — ABNORMAL HIGH (ref 70–99)
Glucose-Capillary: 204 mg/dL — ABNORMAL HIGH (ref 70–99)
Glucose-Capillary: 232 mg/dL — ABNORMAL HIGH (ref 70–99)

## 2020-12-26 MED ORDER — SALINE SPRAY 0.65 % NA SOLN
1.0000 | NASAL | Status: DC | PRN
  Filled 2020-12-26: qty 44

## 2020-12-26 NOTE — Progress Notes (Signed)
  Patient Saturations on 4 Liters of oxygen while Ambulating = 82%  Patient able to ambulate 150 feet on 4L [place] with O2 sats 82%, no c/o SOB or weakness or dizziness.  O2 turned up to 6L and patient recovered to 85% upon arrival to his room.  Patient recovered to 92% within 5 minutes then turned back to 4L.

## 2020-12-26 NOTE — Progress Notes (Signed)
PROGRESS NOTE    Luis Dyer   RCV:[id number]  DOB: Jul 29, 1976  PCP: Patient, No Pcp Per    DOA: 12/13/2020 LOS: 13   Brief Narrative   Patient admitted with acute hypoxic respiratory failure secondary to COVID-19 pneumonia.  Patient's been in the hospital 12 days.  On baricitinib.  On prednisone.  Completed remdesivir.  Patient was on salter high flow nasal cannula most of the hospital stay and finally titrated down to 2.5 L at rest and still desaturating with ambulation.  Repeat CT scan showed bilateral pulmonary embolism and patient was started on Eliquis.  Also added a little low-dose Cardizem CD for tachycardia with standing up.  Continue to evaluate on a daily basis on when to go home.    Continues to desat with ambulation while on 4 L/min supplemental oxygen.  Plan to d/c once maintaining O2 saturations above 88% on 4 L/min with ambulation.     Assessment & Plan   Active Problems:   COVID-19 virus infection   Pneumonia due to COVID-19 virus   Elevated serum creatinine   Acute kidney injury superimposed on CKD (HCC)   Weakness   Elevated d-dimer   Impaired fasting glucose   Pulmonary embolus (HCC)   Acute respiratory failure with hypoxia secondary to COVID-19 pneumonia -presented with O2 sat 87% on room air with shortness of breath.  Continues to require 4 L/min oxygen and desaturates with ambulation, in addition to tachycardia. --Continue prednisone, baricitinib --Completed remdesivir --Wean oxygen as tolerated, maintain O2 sat 88 to 93% --Supportive care with antitussives and bronchodilators --Incentive spirometry and flutter valve   Acute bilateral PE's -hemodynamically stable, seen on CTA chest on admission.   --Continue Eliquis --Started on low-dose Cardizem due to tachycardia with exertion; reassess need in outpatient once recovered from acute illness   AKI superimposed on CKD stage II -resumed on IV fluids on 1/25 as creatinine and had again begun to  her rise.  Continue gentle NS at 50 cc/h.  BMP in the morning.  Avoid nephrotoxins and hypotension (maintain MAP over 65), renally dose meds as indicated.    Impaired fasting glucose -hemoglobin A1c 6.0%.  Patient not diabetic.  Hyperglycemia secondary to steroids.  Monitor.  Generalized weakness -PT evaluated patient and recommend home health PT.   DVT prophylaxis:  apixaban (ELIQUIS) tablet 10 mg  apixaban (ELIQUIS) tablet 5 mg   Diet:  Diet Orders (From admission, onward)    Start     Ordered   12/14/20 0058  Diet regular Room service appropriate? Yes; Fluid consistency: Thin  Diet effective now       Question Answer Comment  Room service appropriate? Yes   Fluid consistency: Thin      12/14/20 0057            Code Status: Full Code    Subjective 12/26/20    Patient seen at bedside today after working with OT and was up to shower.  He showered off oxygen and desaturated into the slow 70s according to therapist.  Patient says he feels pretty good, says feels ready to go.  He did again desaturate with ambulation this afternoon.   Disposition Plan & Communication   Status is: Inpatient  Remains inpatient appropriate because:Inpatient level of care appropriate due to severity of illness with significant O2 desaturations with ambulation   Dispo: The patient is from: Home              Anticipated d/c is to: Home  Anticipated d/c date is: 1 day              Patient currently is not medically stable to d/c.   Difficult to place patient No   Family Communication: Sister updated by phone this afternoon (1/26).    Consults, Procedures, Significant Events   Consultants:   None  Procedures:   None  Antimicrobials:  Anti-infectives (From admission, onward)   Start     Dose/Rate Route Frequency Ordered Stop   12/15/20 1000  remdesivir 100 mg in sodium chloride 0.9 % 100 mL IVPB  Status:  Discontinued       "Followed by" Linked Group Details   100  mg 200 mL/hr over 30 Minutes Intravenous Daily 12/14/20 0057 12/14/20 0110   12/14/20 1000  remdesivir 100 mg in sodium chloride 0.9 % 100 mL IVPB        100 mg 200 mL/hr over 30 Minutes Intravenous Daily 12/13/20 1843 12/17/20 1242   12/13/20 2030  remdesivir 200 mg in sodium chloride 0.9% 250 mL IVPB  Status:  Discontinued       "Followed by" Linked Group Details   200 mg 580 mL/hr over 30 Minutes Intravenous Once 12/14/20 0057 12/14/20 0110   12/13/20 2000  remdesivir 200 mg in sodium chloride 0.9% 250 mL IVPB        200 mg 580 mL/hr over 30 Minutes Intravenous  Once 12/13/20 1843 12/13/20 2247        Objective   Vitals:   12/25/20 2356 12/26/20 0420 12/26/20 0754 12/26/20 1156  BP: 101/65 (!) 93/54 97/64 106/68  Pulse: 86 68 68 98  Resp: 16 16 18 18   Temp: 98.3 F (36.8 C) 98.2 F (36.8 C) 97.8 F (36.6 C) 98.8 F (37.1 C)  TempSrc: Oral  Oral Oral  SpO2: 97% 94% 98% 91%  Weight:      Height:        Intake/Output Summary (Last 24 hours) at 12/26/2020 1644 Last data filed at 12/26/2020 0500 Gross per 24 hour  Intake 550 ml  Output --  Net 550 ml   Filed Weights   12/13/20 1423 12/18/20 0537 12/19/20 0146  Weight: 85.3 kg 74.2 kg 90.9 kg    Physical Exam:  General exam: awake, alert, no acute distress HEENT: moist mucus membranes, hearing grossly normal  Respiratory system: CTAB, no wheezes, rales or rhonchi, normal respiratory effort. Cardiovascular system: normal S1/S2, RRR, no pedal edema.   Gastrointestinal system: soft, NT, ND, +bowel sounds. Central nervous system: A&O x3. no gross focal neurologic deficits, normal speech Extremities: moves all, no cyanosis, normal tone Skin: dry, intact, normal temperature, normal color Psychiatry: normal mood, congruent affect  Labs   Data Reviewed: I have personally reviewed following labs and imaging studies  CBC: Recent Labs  Lab 12/20/20 0431 12/24/20 0527  WBC 15.2* 13.4*  NEUTROABS 13.8*  --   HGB 16.4  14.1  HCT 48.3 41.7  MCV 92.4 92.9  PLT 428* 402*   Basic Metabolic Panel: Recent Labs  Lab 12/21/20 0447 12/22/20 0455 12/23/20 0706 12/24/20 0527 12/25/20 0630  NA 135 136 139 138 140  K 4.7 4.3 4.5 4.5 3.6  CL 101 101 99 100 102  CO2 27 28 31 29 28   GLUCOSE 210* 197* 151* 247* 131*  BUN 37* 31* 27* 29* 25*  CREATININE 1.47* 1.24 1.16 1.27* 1.33*  CALCIUM 8.3* 8.1* 8.7* 8.3* 8.0*   GFR: Estimated Creatinine Clearance: 78.6 mL/min (A) (by C-G formula based  on SCr of 1.33 mg/dL (H)). Liver Function Tests: Recent Labs  Lab 12/21/20 0447  AST 21  ALT 75*  ALKPHOS 101  BILITOT 1.0  PROT 5.9*  ALBUMIN 2.2*   No results for input(s): LIPASE, AMYLASE in the last 168 hours. No results for input(s): AMMONIA in the last 168 hours. Coagulation Profile: No results for input(s): INR, PROTIME in the last 168 hours. Cardiac Enzymes: No results for input(s): CKTOTAL, CKMB, CKMBINDEX, TROPONINI in the last 168 hours. BNP (last 3 results) No results for input(s): PROBNP in the last 8760 hours. HbA1C: No results for input(s): HGBA1C in the last 72 hours. CBG: Recent Labs  Lab 12/25/20 1213 12/25/20 1745 12/25/20 2131 12/26/20 0817 12/26/20 1233  GLUCAP 110* 187* 236* 114* 105*   Lipid Profile: No results for input(s): CHOL, HDL, LDLCALC, TRIG, CHOLHDL, LDLDIRECT in the last 72 hours. Thyroid Function Tests: No results for input(s): TSH, T4TOTAL, FREET4, T3FREE, THYROIDAB in the last 72 hours. Anemia Panel: No results for input(s): VITAMINB12, FOLATE, FERRITIN, TIBC, IRON, RETICCTPCT in the last 72 hours. Sepsis Labs: No results for input(s): PROCALCITON, LATICACIDVEN in the last 168 hours.  No results found for this or any previous visit (from the past 240 hour(s)).    Imaging Studies   No results found.   Medications   Scheduled Meds: . apixaban  10 mg Oral BID   Followed by  . [START ON 12/30/2020] apixaban  5 mg Oral BID  . baricitinib  4 mg Oral Daily   . benzonatate  100 mg Oral TID  . diltiazem  120 mg Oral Daily  . insulin aspart  0-5 Units Subcutaneous QHS  . insulin aspart  0-9 Units Subcutaneous TID WC  . Ipratropium-Albuterol  1 puff Inhalation Q6H  . predniSONE  40 mg Oral Q breakfast   Continuous Infusions: . sodium chloride 50 mL/hr at 12/25/20 1751       LOS: 13 days    Time spent: 30 minutes    Pennie Banter, DO [facility]  12/26/2020, 4:44 PM    If 7PM-7AM, please contact night-coverage. How to contact the [facility] Attending or Consulting provider 7A - 7P or covering provider during after hours 7P -7A, for this patient?    1. Check the care team in [facility] and look for a) attending/consulting TRH provider listed and b) the [facility] team listed 2. Log into [email or web address] and use [place]'s universal password to access. If you do not have the password, please contact the hospital operator. 3. Locate the [facility] provider you are looking for under [facility] and page to a number that you can be directly reached. 4. If you still have difficulty reaching the provider, please page the [facility] (Director on Call) for the Hospitalists listed on amion for assistance.

## 2020-12-26 NOTE — Progress Notes (Signed)
PT Cancellation Note  Patient Details Name: Luis Dyer MRN: [id number] DOB: 1976/10/16   Cancelled Treatment:    Reason Eval/Treat Not Completed: Other (comment).  Chart reviewed.  Upon PT arrival to pt's room, pt resting in bed.  OT present and reporting pt recently took shower and required extra time to recover (with O2 sats).  Per discussion with OT, ambulation immediately after shower did not appear to be best option for pt at this time (d/t respiratory concerns).  Discussed with pt's nurse.  Will re-attempt PT treatment session at a later date/time.  Hendricks Limes, PT 12/26/20, 11:38 AM

## 2020-12-26 NOTE — Progress Notes (Signed)
PT Cancellation Note  Patient Details Name: Luis Dyer MRN: [id number] DOB: 1976-10-10   Cancelled Treatment:    Reason Eval/Treat Not Completed: Other (comment).  Therapist called pt's nurse prior to attempting therapy this afternoon and pt's nurse reporting pt just ambulated with nursing staff and pt's oxygen desaturated to low 80's; nurse recommending holding therapy at this time (d/t recent mobility and oxygen desaturation).  Will re-attempt PT treatment session at a later date/time.  Hendricks Limes, PT 12/26/20, 2:56 PM

## 2020-12-26 NOTE — Hospital Course (Addendum)
Patient admitted with acute hypoxic respiratory failure secondary to COVID-19 pneumonia.  Treated with baricitinib, steroids and remdesivir.  Patient developed bilateral pulmonary emboli, and patient was started on Eliquis.    Patient had persistent oxygen desaturations with ambulation, prolonging his hospital stay.  He eventually improved and able to maintain sats on exertion while on 4 L/min supplemental oxygen.  Physical therapy evaluated patient and recommended home health PT after discharge, which patient declined.  Patient clinically improved and stable for discharge home with supplemental oxygen and outpatient follow up.

## 2020-12-26 NOTE — Progress Notes (Signed)
Occupational Therapy Treatment Patient Details Name: Luis Dyer MRN: [id number] DOB: Jul 23, 1976 Today's Date: 12/26/2020    History of present illness Pt is a 45 y.o. male presenting to hospital 1/13 with L sided chest pain and SOB (gradual onset and worsening over past 3-4 days); (+) COVID 1/7.  Pt seen in ED 1 week prior for gastroenteritis and dehydration.  Pt admitted with COVID-19 PNA, chest pain, acute hypoxic respiratory failure, and acute renal failure.  PMH includes facial fx surgery.   OT comments  Upon entering the room, pt supine in bed and agreeable to OT intervention. Pt on 3 L O2 via [place]. Pt is agreeable to shower this session. OT covered IV's for shower. Pt ambulating with mod I to bathroom without use of AD. Pt requesting to attempt to shower without O2 and therapist is agreeable. Pt standing in shower with intermittent supervision. OT checking O2 with desaturation to 70%. Pt seated on EOB with 3 L placed on pt unable to recover and needing 5 L to full recover to 90%. Pt reports, " I feel fine". OT educating pt that although he feels fine he is may not be and will need to wear O2 to shower at home. OT educated pt on that process along with energy conservation strategies for self care task. OT recommended pt have ventilation in bathroom as well when bathing. Pt verbalized understanding. Pt dressing with sit <>stand from EOB without assistance needed. Pt continues to benefit from OT intervention.    Follow Up Recommendations  Other (comment) pulmonary rehab   Equipment Recommendations  Tub/shower seat       Precautions / Restrictions Precautions Precautions: Fall       Mobility Bed Mobility Overal bed mobility: Modified Independent                Transfers Overall transfer level: Modified independent Equipment used: None Transfers: Sit to/from Stand Sit to Stand: Modified independent (Device/Increase time)              Balance Overall balance  assessment: Modified Independent Sitting-balance support: No upper extremity supported;Feet supported       Standing balance support: Single extremity supported;During functional activity Standing balance-Leahy Scale: Fair Standing balance comment: supervision for shower secondary to increased fall risk with task                           ADL either performed or assessed with clinical judgement   ADL                                         General ADL Comments: set up A to obtain needed items with intermittent supervision for shower     Vision Patient Visual Report: No change from baseline            Cognition Arousal/Alertness: Awake/alert Behavior During Therapy: WFL for tasks assessed/performed Overall Cognitive Status: Within Functional Limits for tasks assessed                                                     Pertinent Vitals/ Pain       Pain Assessment: No/denies pain  Frequency  Min 1X/week        Progress Toward Goals  OT Goals(current goals can now be found in the care plan section)  Progress towards OT goals: Progressing toward goals  Acute Rehab OT Goals Patient Stated Goal: to go home OT Goal Formulation: With patient Time For Goal Achievement: 01/04/21 Potential to Achieve Goals: Good  Plan Discharge plan remains appropriate       AM-PAC OT "6 Clicks" Daily Activity     Outcome Measure   Help from another person eating meals?: None Help from another person taking care of personal grooming?: None Help from another person toileting, which includes using toliet, bedpan, or urinal?: None Help from another person bathing (including washing, rinsing, drying)?: None Help from another person to put on and taking off regular upper body clothing?: None Help from another person to put on and taking off regular lower body clothing?: None 6 Click Score: 24    End of Session Equipment  Utilized During Treatment: Oxygen  OT Visit Diagnosis: Muscle weakness (generalized) (M62.81)   Activity Tolerance Patient tolerated treatment well   Patient Left in bed;with call bell/phone within reach   Nurse Communication Mobility status        Time: 7782-4235 OT Time Calculation (min): 54 min  Charges: OT General Charges $OT Visit: 1 Visit OT Treatments $Self Care/Home Management : 53-67 mins  Jackquline Denmark, MS, OTR/L , CBIS ascom [phone]  12/26/20, 4:01 PM

## 2020-12-27 ENCOUNTER — Other Ambulatory Visit: Payer: Self-pay | Admitting: Internal Medicine

## 2020-12-27 LAB — GLUCOSE, CAPILLARY
Glucose-Capillary: 114 mg/dL — ABNORMAL HIGH (ref 70–99)
Glucose-Capillary: 187 mg/dL — ABNORMAL HIGH (ref 70–99)

## 2020-12-27 LAB — CBC
HCT: 39.2 % (ref 39.0–52.0)
Hemoglobin: 13.2 g/dL (ref 13.0–17.0)
MCH: 31.7 pg (ref 26.0–34.0)
MCHC: 33.7 g/dL (ref 30.0–36.0)
MCV: 94.2 fL (ref 80.0–100.0)
Platelets: 354 10*3/uL (ref 150–400)
RBC: 4.16 MIL/uL — ABNORMAL LOW (ref 4.22–5.81)
RDW: 14 % (ref 11.5–15.5)
WBC: 14.5 10*3/uL — ABNORMAL HIGH (ref 4.0–10.5)
nRBC: 0 % (ref 0.0–0.2)

## 2020-12-27 LAB — BASIC METABOLIC PANEL
Anion gap: 7 (ref 5–15)
BUN: 25 mg/dL — ABNORMAL HIGH (ref 6–20)
CO2: 30 mmol/L (ref 22–32)
Calcium: 8.1 mg/dL — ABNORMAL LOW (ref 8.9–10.3)
Chloride: 103 mmol/L (ref 98–111)
Creatinine, Ser: 1.27 mg/dL — ABNORMAL HIGH (ref 0.61–1.24)
GFR, Estimated: >60 mL/min (ref >60)
Glucose, Bld: 146 mg/dL — ABNORMAL HIGH (ref 70–99)
Potassium: 4 mmol/L (ref 3.5–5.1)
Sodium: 140 mmol/L (ref 135–145)

## 2020-12-27 MED ORDER — GABAPENTIN 100 MG PO CAPS: 200.0000 mg | ORAL_CAPSULE | Freq: Three times a day (TID) | ORAL | 0 refills | Status: DC | PRN

## 2020-12-27 MED ORDER — GABAPENTIN 100 MG PO CAPS
200.0000 mg | ORAL_CAPSULE | Freq: Three times a day (TID) | ORAL | Status: DC | PRN
Start: 2020-12-27 — End: 2020-12-27
  Administered 2020-12-27: 200 mg via ORAL
  Filled 2020-12-27: qty 2

## 2020-12-27 MED ORDER — IPRATROPIUM-ALBUTEROL 20-100 MCG/ACT IN AERS: 1.0000 | INHALATION_SPRAY | Freq: Four times a day (QID) | RESPIRATORY_TRACT | 1 refills | Status: AC | PRN

## 2020-12-27 MED ORDER — GUAIFENESIN-DM 100-10 MG/5ML PO SYRP: 10.0000 mL | ORAL_SOLUTION | ORAL | 0 refills | Status: AC | PRN

## 2020-12-27 MED ORDER — APIXABAN 5 MG PO TABS: ORAL_TABLET | ORAL | 0 refills | Status: DC

## 2020-12-27 MED ORDER — BENZONATATE 100 MG PO CAPS: 100.0000 mg | ORAL_CAPSULE | Freq: Three times a day (TID) | ORAL | 0 refills | Status: DC | PRN

## 2020-12-27 MED ORDER — SALINE SPRAY 0.65 % NA SOLN: 1.0000 | NASAL | 0 refills | Status: AC | PRN

## 2020-12-27 MED ORDER — PREDNISONE 10 MG PO TABS: ORAL_TABLET | ORAL | 0 refills | Status: DC

## 2020-12-27 NOTE — Discharge Summary (Signed)
Physician Discharge Summary  Luis Dyer ZHY:[id number] DOB: 08/27/1976 DOA: 12/13/2020  PCP: Patient, No Pcp Per  Admit date: 12/13/2020 Discharge date: 01/04/2021  Admitted From: home Disposition:  home  Recommendations for Outpatient Follow-up:  1. Follow up with PCP in 1-2 weeks 2. Please obtain BMP/CBC in one week 3. Please follow up on patient's oxygen requirements.  At time of discharge, patient is requiring 4 L/min oxygen.     Home Health: No, pt declined home health PT  Equipment/Devices: Oxygen   Discharge Condition: Stable  CODE STATUS: Full  Diet recommendation: Regular   Discharge Diagnoses: Active Problems:   COVID-19 virus infection   Pneumonia due to COVID-19 virus   Elevated serum creatinine   Acute kidney injury superimposed on CKD (HCC)   Weakness   Elevated d-dimer   Impaired fasting glucose   Pulmonary embolus (HCC)    Summary of HPI and Hospital Course:  Patient admitted with acute hypoxic respiratory failure secondary to COVID-19 pneumonia.  Treated with baricitinib, steroids and remdesivir.  Patient developed bilateral pulmonary emboli, and patient was started on Eliquis.    Patient had persistent oxygen desaturations with ambulation, prolonging his hospital stay.  He eventually improved and able to maintain sats on exertion while on 4 L/min supplemental oxygen.  Physical therapy evaluated patient and recommended home health PT after discharge, which patient declined.  Patient clinically improved and stable for discharge home with supplemental oxygen and outpatient follow up.    Acute respiratory failure with hypoxia secondary to COVID-19 pneumonia -presented with O2 sat 87% on room air with shortness of breath.   --Discharge with prednisone taper, supplemental oxygen --Pt given instructions for oxygen weaning as able, maintaining O2 sat at or above 88%.   Acute bilateral PE's -hemodynamically stable, seen on CTA chest on admission.   Anticoagulation with Eliquis, low-dose Cardizem due to tachycardia with exertion; reassess need in outpatient once recovered from acute illness.  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 AKI secondary to prerenal azotemia.  BMP in follow up to  Monitor.   Pre-diabetes / Impaired fasting glucose -hemoglobin A1c 6.0%.  Patient not diabetic.  Hyperglycemia secondary to steroids.  PCP follow up.  Generalized weakness -PT evaluated patient and recommended home health PT. Pt declined.  Discharge Instructions   Discharge Instructions    Call MD for:   Complete by: As directed    Progressively worsening shortness of breath, or having to keep oxygen turned up above 4 L/min for longer than 30 minutes.   Call MD for:  extreme fatigue   Complete by: As directed    Call MD for:  persistant dizziness or light-headedness   Complete by: As directed    Call MD for:  persistant nausea and vomiting   Complete by: As directed    Call MD for:  severe uncontrolled pain   Complete by: As directed    Call MD for:  temperature >100.4   Complete by: As directed    Diet - low sodium heart healthy   Complete by: As directed    Discharge instructions   Complete by: As directed    Continue using oxygen as needed to keep your oxygen "sat" level at or above 88%.  Your target oxygen level is between 88-93%.   If the level stays above 93%, you can turn oxygen down by 1 L/min.   As you recover, you should be able to turn down the oxygen gradually, until you are off  it completely.  Eliquis (apixaban) - is blood thinner medication for blood clot in the lungs (pulmonary embolism).  You need to take 10 mg twice a day for the next 3 days, then reduce to 5 mg twice daily.    Please be sure to follow up at [facility] to get established with primary care doctor to manage your medical care.   Increase activity slowly   Complete by: As directed      Allergies as of 12/27/2020   No Known  Allergies     Medication List    STOP taking these medications   naproxen 500 MG tablet Commonly known as: Naprosyn     TAKE these medications   apixaban 5 MG Tabs tablet Commonly known as: ELIQUIS Take 2 tablets (10 mg total) by mouth 2 (two) times daily for 3 days, THEN 1 tablet (5 mg total) 2 (two) times daily. Start taking on: December 27, 2020   benzonatate 100 MG capsule Commonly known as: TESSALON Take 1 capsule (100 mg total) by mouth 3 (three) times daily as needed for cough.   famotidine 20 MG tablet Commonly known as: PEPCID Take 1 tablet (20 mg total) by mouth 2 (two) times daily.   gabapentin 100 MG capsule Commonly known as: NEURONTIN Take 2 capsules (200 mg total) by mouth 3 (three) times daily as needed for up to 7 days (left arm nerve pain).   guaiFENesin-dextromethorphan 100-10 MG/5ML syrup Commonly known as: ROBITUSSIN DM Take 10 mLs by mouth every 4 (four) hours as needed for cough.   Ipratropium-Albuterol 20-100 MCG/ACT Aers respimat Commonly known as: COMBIVENT Inhale 1 puff into the lungs every 6 (six) hours as needed for wheezing or shortness of breath.   ondansetron 4 MG disintegrating tablet Commonly known as: Zofran ODT Take 1 tablet (4 mg total) by mouth every 8 (eight) hours as needed for nausea or vomiting.   predniSONE 10 MG tablet Commonly known as: DELTASONE Take 4 tablets (40 mg total) by mouth daily with breakfast for 2 days, THEN 3 tablets (30 mg total) daily with breakfast for 2 days, THEN 2 tablets (20 mg total) daily with breakfast for 2 days, THEN 1 tablet (10 mg total) daily with breakfast for 2 days. Start taking on: December 27, 2020   sodium chloride 0.65 % Soln nasal spray Commonly known as: OCEAN Place 1 spray into both nostrils as needed for congestion.       Follow-up Information    [facility]. Go on 01/03/2021.   Why: @ 9:30am Contact information: [address] [place]  [address] [phone]       [facility]. Schedule an appointment as soon as possible for a visit in 1 week.   Specialty: Primary Care Why: Since he is not a patient they stated that he will have to go and submit documents and paper work himself Contact information: [address] [employer] [address] [place] [address] [phone]             No Known Allergies  Consultations:  none   Procedures/Studies: DG Chest 2 View  Result Date: 12/13/2020 CLINICAL DATA:  Left chest pain and shortness of breath. COVID-19 positive patient. EXAM: CHEST - 2 VIEW COMPARISON:  None. FINDINGS: There is extensive bilateral airspace disease consistent with pneumonia. No pneumothorax or pleural effusion. Heart size is normal. IMPRESSION: Extensive multifocal pneumonia has an appearance most compatible with COVID-19 infection. Electronically Signed  By: Drusilla Kanner M.D.   On: 12/13/2020 15:07   CT ANGIO CHEST PE W OR WO CONTRAST  Result Date: 12/23/2020 CLINICAL DATA:  Increased shortness of breath, COVID-19 positive, positive D dimer EXAM: CT ANGIOGRAPHY CHEST WITH CONTRAST TECHNIQUE: Multidetector CT imaging of the chest was performed using the standard protocol during bolus administration of intravenous contrast. Multiplanar CT image reconstructions and MIPs were obtained to evaluate the vascular anatomy. CONTRAST:  OMNIPAQUE IOHEXOL 350 MG/ML SOLN COMPARISON:  12/14/2020 FINDINGS: Cardiovascular: This is a technically adequate evaluation of the pulmonary vasculature. Small segmental emboli are seen within the right upper, right middle, and left lower lobes. Clot burden is minimal, with no evidence of right heart strain. The heart is unremarkable without pericardial effusion. No evidence of thoracic aortic aneurysm or dissection. Mediastinum/Nodes: No enlarged mediastinal, hilar, or axillary lymph nodes. Thyroid gland, trachea, and esophagus demonstrate no  significant findings. Lungs/Pleura: The multifocal bilateral airspace disease seen previously is again identified, with slight improvement in the interim. No effusion or pneumothorax. Central airways are patent. Upper Abdomen: No acute abnormality. Musculoskeletal: No acute or destructive bony lesions. Reconstructed images demonstrate no additional findings. Review of the MIP images confirms the above findings. IMPRESSION: 1. Bilateral segmental pulmonary emboli, with minimal clot burden. No evidence of right heart strain. 2. Slight interval improvement in the multifocal bilateral COVID-19 pneumonia. Critical Value/emergent results were called by telephone at the time of interpretation on 12/23/2020 at 5:34 pm to provider Alford Highland , who verbally acknowledged these results. Electronically Signed   By: Sharlet Salina M.D.   On: 12/23/2020 17:34   CT ANGIO CHEST PE W OR WO CONTRAST  Result Date: 12/14/2020 CLINICAL DATA:  Chest pain and COVID EXAM: CT ANGIOGRAPHY CHEST WITH CONTRAST TECHNIQUE: Multidetector CT imaging of the chest was performed using the standard protocol during bolus administration of intravenous contrast. Multiplanar CT image reconstructions and MIPs were obtained to evaluate the vascular anatomy. CONTRAST:  OMNIPAQUE IOHEXOL 350 MG/ML SOLN COMPARISON:  None. FINDINGS: Cardiovascular: Slightly suboptimal opacification of the main pulmonary artery seen. No central or proximal segmental pulmonary embolism is noted. The heart is normal in size. No pericardial effusion or thickening. No evidence right heart strain. There is normal three-vessel brachiocephalic anatomy without proximal stenosis. The thoracic aorta is normal in appearance. Mediastinum/Nodes: No hilar, mediastinal, or axillary adenopathy. Thyroid gland, trachea, and esophagus demonstrate no significant findings. Lungs/Pleura: Extensive multifocal patchy airspace opacities are seen throughout both lungs. Air bronchograms are  seen within both lung bases. No pleural effusion or pneumothorax. Upper Abdomen: No acute abnormalities present in the visualized portions of the upper abdomen. Musculoskeletal: No chest wall abnormality. No acute or significant osseous findings. Review of the MIP images confirms the above findings. IMPRESSION: Slightly suboptimal opacification of the main pulmonary artery, however no central or proximal segmental pulmonary embolism. Extensive airspace opacities, consistent with multifocal atypical viral pneumonia. Electronically Signed   By: Jonna Clark M.D.   On: 12/14/2020 19:27   US Venous Img Lower Bilateral (DVT)  Result Date: 12/20/2020 CLINICAL DATA:  Elevated D-dimer. Bilateral lower extremity pain. History of COVID-19 infection. Evaluate for DVT. EXAM: BILATERAL LOWER EXTREMITY VENOUS DOPPLER ULTRASOUND TECHNIQUE: Gray-scale sonography with graded compression, as well as color Doppler and duplex ultrasound were performed to evaluate the lower extremity deep venous systems from the level of the common femoral vein and including the common femoral, femoral, profunda femoral, popliteal and calf veins including the posterior tibial, peroneal and gastrocnemius veins  when visible. The superficial great saphenous vein was also interrogated. Spectral Doppler was utilized to evaluate flow at rest and with distal augmentation maneuvers in the common femoral, femoral and popliteal veins. COMPARISON:  None. FINDINGS: RIGHT LOWER EXTREMITY Common Femoral Vein: No evidence of thrombus. Normal compressibility, respiratory phasicity and response to augmentation. Saphenofemoral Junction: No evidence of thrombus. Normal compressibility and flow on color Doppler imaging. Profunda Femoral Vein: No evidence of thrombus. Normal compressibility and flow on color Doppler imaging. Femoral Vein: No evidence of thrombus. Normal compressibility, respiratory phasicity and response to augmentation. Popliteal Vein: No evidence of  thrombus. Normal compressibility, respiratory phasicity and response to augmentation. Calf Veins: No evidence of thrombus. Normal compressibility and flow on color Doppler imaging. Superficial Great Saphenous Vein: No evidence of thrombus. Normal compressibility. Venous Reflux:  None. Other Findings:  None. LEFT LOWER EXTREMITY Common Femoral Vein: No evidence of thrombus. Normal compressibility, respiratory phasicity and response to augmentation. Saphenofemoral Junction: No evidence of thrombus. Normal compressibility and flow on color Doppler imaging. Profunda Femoral Vein: No evidence of thrombus. Normal compressibility and flow on color Doppler imaging. Femoral Vein: No evidence of thrombus. Normal compressibility, respiratory phasicity and response to augmentation. Popliteal Vein: No evidence of thrombus. Normal compressibility, respiratory phasicity and response to augmentation. Calf Veins: No evidence of thrombus. Normal compressibility and flow on color Doppler imaging. Superficial Great Saphenous Vein: No evidence of thrombus. Normal compressibility. Venous Reflux:  None. Other Findings:  None. IMPRESSION: No evidence of DVT within either lower extremity. Electronically Signed   By: Simonne Come M.D.   On: 12/20/2020 16:13       Subjective: Pt says he feels well.  DOE continues to improve.  No SOB at rest.  No other acute complaints.  Asked again about HH PT and he declines.   Discharge Exam: Vitals:   12/27/20 1204 12/27/20 1216  BP: 103/68   Pulse: 98   Resp: 20   Temp: 98.9 F (37.2 C)   SpO2: 96% (!) 83%   Vitals:   12/27/20 0347 12/27/20 0801 12/27/20 1204 12/27/20 1216  BP: 109/68 (!) 90/58 103/68   Pulse: 62 61 98   Resp: 20 20 20    Temp: 98 F (36.7 C) 98.6 F (37 C) 98.9 F (37.2 C)   TempSrc: Oral Oral Oral   SpO2: 94% 97% 96% (!) 83%  Weight:      Height:        General: Pt is alert, awake, not in acute distress Cardiovascular: RRR, S1/S2 +, no rubs, no  gallops Respiratory: CTA bilaterally, no wheezing, no rhonchi Abdominal: Soft, NT, ND, bowel sounds + Extremities: no edema, no cyanosis    The results of significant diagnostics from this hospitalization (including imaging, microbiology, ancillary and laboratory) are listed below for reference.     Microbiology: No results found for this or any previous visit (from the past 240 hour(s)).   Labs: BNP (last 3 results) No results for input(s): BNP in the last 8760 hours. Basic Metabolic Panel: No results for input(s): NA, K, CL, CO2, GLUCOSE, BUN, CREATININE, CALCIUM, MG, PHOS in the last 168 hours. Liver Function Tests: No results for input(s): AST, ALT, ALKPHOS, BILITOT, PROT, ALBUMIN in the last 168 hours. No results for input(s): LIPASE, AMYLASE in the last 168 hours. No results for input(s): AMMONIA in the last 168 hours. CBC: No results for input(s): WBC, NEUTROABS, HGB, HCT, MCV, PLT in the last 168 hours. Cardiac Enzymes: No results for input(s): CKTOTAL, CKMB,  CKMBINDEX, TROPONINI in the last 168 hours. BNP: Invalid input(s): POCBNP CBG: No results for input(s): GLUCAP in the last 168 hours. D-Dimer No results for input(s): DDIMER in the last 72 hours. Hgb A1c No results for input(s): HGBA1C in the last 72 hours. Lipid Profile No results for input(s): CHOL, HDL, LDLCALC, TRIG, CHOLHDL, LDLDIRECT in the last 72 hours. Thyroid function studies No results for input(s): TSH, T4TOTAL, T3FREE, THYROIDAB in the last 72 hours.  Invalid input(s): FREET3 Anemia work up No results for input(s): VITAMINB12, FOLATE, FERRITIN, TIBC, IRON, RETICCTPCT in the last 72 hours. Urinalysis    Component Value Date/Time   COLORURINE AMBER (A) 12/07/2020 1003   APPEARANCEUR CLEAR (A) 12/07/2020 1003   LABSPEC 1.027 12/07/2020 1003   PHURINE 5.0 12/07/2020 1003   GLUCOSEU NEGATIVE 12/07/2020 1003   HGBUR SMALL (A) 12/07/2020 1003   BILIRUBINUR NEGATIVE 12/07/2020 1003   KETONESUR 20  (A) 12/07/2020 1003   PROTEINUR >=300 (A) 12/07/2020 1003   NITRITE NEGATIVE 12/07/2020 1003   LEUKOCYTESUR NEGATIVE 12/07/2020 1003   Sepsis Labs Invalid input(s): PROCALCITONIN,  WBC,  LACTICIDVEN Microbiology No results found for this or any previous visit (from the past 240 hour(s)).   Time coordinating discharge: Over 30 minutes  SIGNED:   Pennie Banter, DO [facility] 01/04/2021, 1:59 PM   If 7PM-7AM, please contact night-coverage [email or web address]

## 2020-12-27 NOTE — TOC Transition Note (Signed)
Transition of Care [facility]) - CM/SW Discharge Note   Patient Details  Name: Luis Dyer MRN: [id number] Date of Birth: 06-Nov-1976  [facility]) CM/SW Contact:  Allayne Butcher, RN Phone Number: 12/27/2020, 1:33 PM   Clinical Narrative:     Patient medically cleared for discharge home with home health PT.  [facility] accepted charity referral and [place] with Rolene Arbour is aware of DC today.  Patient qualifies for home O2 4L.  Oxygen ordered from [facility] with their charity care and delivered to the patient's room.  Patient's family will be picking him up.  Patient is aware that he needs to follow up with the [facility].    Final next level of care: Home w Home Health Services Barriers to Discharge: Barriers Resolved   Patient Goals and CMS Choice Patient states their goals for this hospitalization and ongoing recovery are:: to get better and get home CMS Medicare.gov Compare Post Acute Care list provided to:: Patient Choice offered to / list presented to : Patient  Discharge Placement                       Discharge Plan and Services   Discharge Planning Services: CM Consult,Medication Assistance,Indigent Health Clinic Post Acute Care Choice: Home Health          DME Arranged: Oxygen,Pulse oximeter DME Agency: [facility] Date DME Agency Contacted: 12/27/20 Time DME Agency Contacted: 1100 Representative spoke with at DME Agency: Ian Malkin HH Arranged: PT HH Agency: [facility] Date [facility] Agency Contacted: 12/27/20 Time HH Agency Contacted: 1333 Representative spoke with at [facility] Agency: [place]  Social Determinants of Health (SDOH) Interventions     Readmission Risk Interventions No flowsheet data found.

## 2020-12-27 NOTE — Progress Notes (Signed)
Physical Therapy Treatment Patient Details Name: Luis Dyer MRN: [id number] DOB: 19-Nov-1976 Today's Date: 12/27/2020    History of Present Illness Pt is a 45 y.o. male presenting to hospital 1/13 with L sided chest pain and SOB (gradual onset and worsening over past 3-4 days); (+) COVID 1/7.  Pt seen in ED 1 week prior for gastroenteritis and dehydration.  Pt admitted with COVID-19 PNA, chest pain, acute hypoxic respiratory failure, and acute renal failure.  PMH includes facial fx surgery.    PT Comments    Patient received in bed, reports he is feeling fine. Agrees to walk. He reports no sob with mobility, however sats down to low 80%s on 4 liters of O2. Patient is independent with mobility, no AD. No LOB. He will benefit from HHPT or at least [facility] to manage O2 needs going forward.      Follow Up Recommendations  Home health PT     Equipment Recommendations  None recommended by PT    Recommendations for Other Services       Precautions / Restrictions Restrictions Weight Bearing Restrictions: No    Mobility  Bed Mobility Overal bed mobility: Independent                Transfers Overall transfer level: Independent                  Ambulation/Gait Ambulation/Gait assistance: Supervision Gait Distance (Feet): 300 Feet Assistive device: IV Pole;None Gait Pattern/deviations: Step-through pattern Gait velocity: WNL   General Gait Details: patient reports no sob, however O2 sats 83% ambulating on 4 lpm.   Stairs             Wheelchair Mobility    Modified Rankin (Stroke Patients Only)       Balance Overall balance assessment: Modified Independent Sitting-balance support: Feet supported Sitting balance-Leahy Scale: Normal     Standing balance support: During functional activity;No upper extremity supported Standing balance-Leahy Scale: Good                              Cognition Arousal/Alertness: Awake/alert Behavior  During Therapy: WFL for tasks assessed/performed Overall Cognitive Status: Within Functional Limits for tasks assessed                                        Exercises      General Comments        Pertinent Vitals/Pain Pain Assessment: No/denies pain    Home Living                      Prior Function            PT Goals (current goals can now be found in the care plan section) Acute Rehab PT Goals Patient Stated Goal: to go home PT Goal Formulation: With patient Time For Goal Achievement: 01/03/21 Potential to Achieve Goals: Good Progress towards PT goals: Progressing toward goals    Frequency    Min 2X/week      PT Plan Current plan remains appropriate    Co-evaluation              AM-PAC PT "6 Clicks" Mobility   Outcome Measure  Help needed turning from your back to your side while in a flat bed without using bedrails?: None Help needed moving from  lying on your back to sitting on the side of a flat bed without using bedrails?: None Help needed moving to and from a bed to a chair (including a wheelchair)?: None Help needed standing up from a chair using your arms (e.g., wheelchair or bedside chair)?: None Help needed to walk in hospital room?: None Help needed climbing 3-5 steps with a railing? : None 6 Click Score: 24    End of Session Equipment Utilized During Treatment: Oxygen Activity Tolerance: Patient tolerated treatment well Patient left: in bed;with call bell/phone within reach Nurse Communication: Mobility status PT Visit Diagnosis: Difficulty in walking, not elsewhere classified (R26.2)     Time: 1110-1130 PT Time Calculation (min) (ACUTE ONLY): 20 min  Charges:  $Gait Training: 8-22 mins                     [employer], PT, GCS 12/27/20,12:21 PM

## 2021-01-03 ENCOUNTER — Inpatient Hospital Stay: Payer: Self-pay

## 2021-01-04 ENCOUNTER — Other Ambulatory Visit: Payer: Self-pay | Admitting: Nurse Practitioner

## 2021-01-04 ENCOUNTER — Ambulatory Visit (INDEPENDENT_AMBULATORY_CARE_PROVIDER_SITE_OTHER): Payer: Self-pay | Admitting: Nurse Practitioner

## 2021-01-04 DIAGNOSIS — J1282 Pneumonia due to coronavirus disease 2019: Secondary | ICD-10-CM

## 2021-01-04 DIAGNOSIS — U071 COVID-19: Secondary | ICD-10-CM

## 2021-01-04 MED ORDER — NYSTATIN 100000 UNIT/GM EX CREA: 1.0000 "application " | TOPICAL_CREAM | Freq: Two times a day (BID) | CUTANEOUS | 0 refills | Status: DC

## 2021-01-04 NOTE — Patient Instructions (Addendum)
Covid pneumonia Respiratory failure:   Stay well hydrated  Stay active  Deep breathing exercises  May take tylenol or fever or pain  May take mucinex twice daily     Follow up:  Follow up in 1 week or sooner if needed

## 2021-01-04 NOTE — Assessment & Plan Note (Addendum)
Respiratory failure:   Stay well hydrated  Stay active  Deep breathing exercises  May take tylenol or fever or pain  May take mucinex twice daily  Continue to wean O2 to keep O2 sats above 93%     Follow up:  Follow up in 1 week or sooner if needed

## 2021-01-04 NOTE — Progress Notes (Signed)
@Patient  ID: Luis Dyer, male    DOB: 1976/10/15, 45 y.o.   MRN: [id number]  Chief Complaint  Patient presents with  . Hospitalization Follow-up    Much improved - weaning oxygen - wants to go back to work    Referring provider: No ref. provider found   45 year old male with no significant health history.  HPI  Patient presents today for post COVID care clinic visit.  He was admitted to the hospital on 12/13/2020 through 12/27/2020.  Patient was admitted with acute hypoxic respiratory failure secondary to Covid pneumonia.  Patient was treated with remdesivir, baricitinib, prednisone.  CT scan revealed bilateral pulmonary embolism.  Patient was discharged home on Eliquis.  He was also discharged home with supplemental oxygen at 4 L/min continuous.  Patient states that he is compliant with Eliquis.  He has been doing well since hospital discharge.  He is trying to wean his oxygen.  He does have a pulse oximeter and checks his O2 sats frequently.  He is currently on room air at rest and 2 L with exertion.  Patient has been trying to stay active and does have a walking routine.  He has been eating and drinking well.  He does need a primary care physician and we discussed that we will set him up appointment to establish care with a new PCP before and visit today. Denies f/c/s, n/v/d, hemoptysis, PND, chest pain or edema.      No Known Allergies   There is no immunization history on file for this patient.  History reviewed. No pertinent past medical history.  Tobacco History: Social History   Tobacco Use  Smoking Status Never Smoker  Smokeless Tobacco Never Used   Counseling given: Not Answered   Outpatient Encounter Medications as of 01/04/2021  Medication Sig  . nystatin cream (MYCOSTATIN) Apply 1 application topically 2 (two) times daily.  Marland Kitchen apixaban (ELIQUIS) 5 MG TABS tablet Take 2 tablets (10 mg total) by mouth 2 (two) times daily for 3 days, THEN 1 tablet (5 mg total)  2 (two) times daily.  . benzonatate (TESSALON) 100 MG capsule Take 1 capsule (100 mg total) by mouth 3 (three) times daily as needed for cough.  . famotidine (PEPCID) 20 MG tablet Take 1 tablet (20 mg total) by mouth 2 (two) times daily.  Marland Kitchen gabapentin (NEURONTIN) 100 MG capsule Take 2 capsules (200 mg total) by mouth 3 (three) times daily as needed for up to 7 days (left arm nerve pain).  Marland Kitchen guaiFENesin-dextromethorphan (ROBITUSSIN DM) 100-10 MG/5ML syrup Take 10 mLs by mouth every 4 (four) hours as needed for cough.  . Ipratropium-Albuterol (COMBIVENT) 20-100 MCG/ACT AERS respimat Inhale 1 puff into the lungs every 6 (six) hours as needed for wheezing or shortness of breath.  . ondansetron (ZOFRAN ODT) 4 MG disintegrating tablet Take 1 tablet (4 mg total) by mouth every 8 (eight) hours as needed for nausea or vomiting.  . predniSONE (DELTASONE) 10 MG tablet Take 4 tablets (40 mg total) by mouth daily with breakfast for 2 days, THEN 3 tablets (30 mg total) daily with breakfast for 2 days, THEN 2 tablets (20 mg total) daily with breakfast for 2 days, THEN 1 tablet (10 mg total) daily with breakfast for 2 days.  . sodium chloride (OCEAN) 0.65 % SOLN nasal spray Place 1 spray into both nostrils as needed for congestion.   No facility-administered encounter medications on file as of 01/04/2021.     Review of Systems  Review  of Systems  Constitutional: Negative.  Negative for fatigue and fever.  HENT: Negative.   Respiratory: Positive for cough and shortness of breath.   Cardiovascular: Positive for chest pain. Negative for palpitations and leg swelling.  Gastrointestinal: Negative.   Allergic/Immunologic: Negative.   Neurological: Negative.   Psychiatric/Behavioral: Negative.        Physical Exam  There were no vitals taken for this visit.  Wt Readings from Last 5 Encounters:  12/19/20 200 lb 4.8 oz (90.9 kg)  12/07/20 188 lb (85.3 kg)     Physical Exam Vitals and nursing note  reviewed.  Constitutional:      General: He is not in acute distress.    Appearance: He is well-developed and well-nourished.  Cardiovascular:     Rate and Rhythm: Normal rate and regular rhythm.  Pulmonary:     Effort: Pulmonary effort is normal.     Breath sounds: Normal breath sounds.  Musculoskeletal:     Right lower leg: No edema.     Left lower leg: No edema.  Skin:    General: Skin is warm and dry.  Neurological:     Mental Status: He is alert and oriented to person, place, and time.  Psychiatric:        Mood and Affect: Mood and affect and mood normal.        Behavior: Behavior normal.       Imaging: DG Chest 2 View  Result Date: 12/13/2020 CLINICAL DATA:  Left chest pain and shortness of breath. COVID-19 positive patient. EXAM: CHEST - 2 VIEW COMPARISON:  None. FINDINGS: There is extensive bilateral airspace disease consistent with pneumonia. No pneumothorax or pleural effusion. Heart size is normal. IMPRESSION: Extensive multifocal pneumonia has an appearance most compatible with COVID-19 infection. Electronically Signed   By: Drusilla Kanner M.D.   On: 12/13/2020 15:07   CT ANGIO CHEST PE W OR WO CONTRAST  Result Date: 12/23/2020 CLINICAL DATA:  Increased shortness of breath, COVID-19 positive, positive D dimer EXAM: CT ANGIOGRAPHY CHEST WITH CONTRAST TECHNIQUE: Multidetector CT imaging of the chest was performed using the standard protocol during bolus administration of intravenous contrast. Multiplanar CT image reconstructions and MIPs were obtained to evaluate the vascular anatomy. CONTRAST:  OMNIPAQUE IOHEXOL 350 MG/ML SOLN COMPARISON:  12/14/2020 FINDINGS: Cardiovascular: This is a technically adequate evaluation of the pulmonary vasculature. Small segmental emboli are seen within the right upper, right middle, and left lower lobes. Clot burden is minimal, with no evidence of right heart strain. The heart is unremarkable without pericardial effusion. No evidence  of thoracic aortic aneurysm or dissection. Mediastinum/Nodes: No enlarged mediastinal, hilar, or axillary lymph nodes. Thyroid gland, trachea, and esophagus demonstrate no significant findings. Lungs/Pleura: The multifocal bilateral airspace disease seen previously is again identified, with slight improvement in the interim. No effusion or pneumothorax. Central airways are patent. Upper Abdomen: No acute abnormality. Musculoskeletal: No acute or destructive bony lesions. Reconstructed images demonstrate no additional findings. Review of the MIP images confirms the above findings. IMPRESSION: 1. Bilateral segmental pulmonary emboli, with minimal clot burden. No evidence of right heart strain. 2. Slight interval improvement in the multifocal bilateral COVID-19 pneumonia. Critical Value/emergent results were called by telephone at the time of interpretation on 12/23/2020 at 5:34 pm to provider Alford Highland , who verbally acknowledged these results. Electronically Signed   By: Sharlet Salina M.D.   On: 12/23/2020 17:34   CT ANGIO CHEST PE W OR WO CONTRAST  Result Date: 12/14/2020 CLINICAL DATA:  Chest pain and COVID EXAM: CT ANGIOGRAPHY CHEST WITH CONTRAST TECHNIQUE: Multidetector CT imaging of the chest was performed using the standard protocol during bolus administration of intravenous contrast. Multiplanar CT image reconstructions and MIPs were obtained to evaluate the vascular anatomy. CONTRAST:  OMNIPAQUE IOHEXOL 350 MG/ML SOLN COMPARISON:  None. FINDINGS: Cardiovascular: Slightly suboptimal opacification of the main pulmonary artery seen. No central or proximal segmental pulmonary embolism is noted. The heart is normal in size. No pericardial effusion or thickening. No evidence right heart strain. There is normal three-vessel brachiocephalic anatomy without proximal stenosis. The thoracic aorta is normal in appearance. Mediastinum/Nodes: No hilar, mediastinal, or axillary adenopathy. Thyroid gland,  trachea, and esophagus demonstrate no significant findings. Lungs/Pleura: Extensive multifocal patchy airspace opacities are seen throughout both lungs. Air bronchograms are seen within both lung bases. No pleural effusion or pneumothorax. Upper Abdomen: No acute abnormalities present in the visualized portions of the upper abdomen. Musculoskeletal: No chest wall abnormality. No acute or significant osseous findings. Review of the MIP images confirms the above findings. IMPRESSION: Slightly suboptimal opacification of the main pulmonary artery, however no central or proximal segmental pulmonary embolism. Extensive airspace opacities, consistent with multifocal atypical viral pneumonia. Electronically Signed   By: Jonna Clark M.D.   On: 12/14/2020 19:27   US Venous Img Lower Bilateral (DVT)  Result Date: 12/20/2020 CLINICAL DATA:  Elevated D-dimer. Bilateral lower extremity pain. History of COVID-19 infection. Evaluate for DVT. EXAM: BILATERAL LOWER EXTREMITY VENOUS DOPPLER ULTRASOUND TECHNIQUE: Gray-scale sonography with graded compression, as well as color Doppler and duplex ultrasound were performed to evaluate the lower extremity deep venous systems from the level of the common femoral vein and including the common femoral, femoral, profunda femoral, popliteal and calf veins including the posterior tibial, peroneal and gastrocnemius veins when visible. The superficial great saphenous vein was also interrogated. Spectral Doppler was utilized to evaluate flow at rest and with distal augmentation maneuvers in the common femoral, femoral and popliteal veins. COMPARISON:  None. FINDINGS: RIGHT LOWER EXTREMITY Common Femoral Vein: No evidence of thrombus. Normal compressibility, respiratory phasicity and response to augmentation. Saphenofemoral Junction: No evidence of thrombus. Normal compressibility and flow on color Doppler imaging. Profunda Femoral Vein: No evidence of thrombus. Normal compressibility and flow  on color Doppler imaging. Femoral Vein: No evidence of thrombus. Normal compressibility, respiratory phasicity and response to augmentation. Popliteal Vein: No evidence of thrombus. Normal compressibility, respiratory phasicity and response to augmentation. Calf Veins: No evidence of thrombus. Normal compressibility and flow on color Doppler imaging. Superficial Great Saphenous Vein: No evidence of thrombus. Normal compressibility. Venous Reflux:  None. Other Findings:  None. LEFT LOWER EXTREMITY Common Femoral Vein: No evidence of thrombus. Normal compressibility, respiratory phasicity and response to augmentation. Saphenofemoral Junction: No evidence of thrombus. Normal compressibility and flow on color Doppler imaging. Profunda Femoral Vein: No evidence of thrombus. Normal compressibility and flow on color Doppler imaging. Femoral Vein: No evidence of thrombus. Normal compressibility, respiratory phasicity and response to augmentation. Popliteal Vein: No evidence of thrombus. Normal compressibility, respiratory phasicity and response to augmentation. Calf Veins: No evidence of thrombus. Normal compressibility and flow on color Doppler imaging. Superficial Great Saphenous Vein: No evidence of thrombus. Normal compressibility. Venous Reflux:  None. Other Findings:  None. IMPRESSION: No evidence of DVT within either lower extremity. Electronically Signed   By: Simonne Come M.D.   On: 12/20/2020 16:13     Assessment & Plan:   Pneumonia due to COVID-19 virus Respiratory failure:   Stay  well hydrated  Stay active  Deep breathing exercises  May take tylenol or fever or pain  May take mucinex twice daily  Continue to wean O2 to keep O2 sats above 93%     Follow up:  Follow up in 1 week or sooner if needed       Ivonne Andrew, NP 01/04/2021

## 2021-01-08 ENCOUNTER — Ambulatory Visit: Payer: Self-pay | Admitting: Pharmacy Technician

## 2021-01-08 ENCOUNTER — Other Ambulatory Visit: Payer: Self-pay

## 2021-01-08 DIAGNOSIS — Z79899 Other long term (current) drug therapy: Secondary | ICD-10-CM

## 2021-01-08 NOTE — Progress Notes (Signed)
Luis Dyer [id number] from [facility] [employer] provided interpretation.  Completed [facility] application and contract.  Patient agreed to all terms of the [facility] contract.    Patient to provide 2022 paystubs and 2020 Federal Tax Return.  I also made patient aware that 2021 Federal Tax Return is due by 03/31/21.    Provided patient with Community education officer based on his particular needs.    [name] Care Manager [facility]

## 2021-01-11 ENCOUNTER — Ambulatory Visit (INDEPENDENT_AMBULATORY_CARE_PROVIDER_SITE_OTHER): Payer: Self-pay | Admitting: Nurse Practitioner

## 2021-01-11 ENCOUNTER — Other Ambulatory Visit: Payer: Self-pay

## 2021-01-11 ENCOUNTER — Ambulatory Visit
Admission: RE | Admit: 2021-01-11 | Discharge: 2021-01-11 | Disposition: A | Discharge status: Home / Self Care | Payer: Self-pay | Source: Ambulatory Visit | Attending: Nurse Practitioner | Admitting: Nurse Practitioner

## 2021-01-11 VITALS — BP 104/79 | HR 90 | Temp 96.2°F

## 2021-01-11 DIAGNOSIS — U071 COVID-19: Secondary | ICD-10-CM

## 2021-01-11 DIAGNOSIS — J1282 Pneumonia due to coronavirus disease 2019: Secondary | ICD-10-CM

## 2021-01-11 DIAGNOSIS — J9601 Acute respiratory failure with hypoxia: Secondary | ICD-10-CM

## 2021-01-11 NOTE — Assessment & Plan Note (Addendum)
Respiratory failure:   Stay well hydrated  Stay active  Deep breathing exercises  May take tylenol or fever or pain  May take mucinex twice daily  Will order chest x ray:  [facility] [address] [place], [place] [address] [phone] MON - FRI 8:00 AM - 4:00 PM - WALK IN  Will place referral for pulmonary / [place] - patient request due to transportation issues  Continue Eliquis  May wean O2 to keep sats above 93%   Follow up:  Follow up if needed

## 2021-01-11 NOTE — Patient Instructions (Addendum)
Covid pneumonia Respiratory failure:   Stay well hydrated  Stay active  Deep breathing exercises  May take tylenol or fever or pain  May take mucinex twice daily  Will order chest x ray:  [facility] [address] [place], [place] [address] [phone] MON - FRI 8:00 AM - 4:00 PM - WALK IN  Will place referral for pulmonary  Continue Eliquis  May wean O2 to keep sats above 93%   Follow up:  Follow up if needed

## 2021-01-11 NOTE — Progress Notes (Signed)
@Patient  ID: , male    DOB: [birth year], 45 y.o.   MRN: [id number]  Chief Complaint  Patient presents with  . Follow-up    Follow up. Still using 2 L O2 States that he is slowly improving.    Referring provider: No ref. provider found   45 year old male with no significant health history.  HPI  Patient presents today for post COVID care clinic visit/follow-up.  Patient was last seen in this office on 01/04/2021.  He was seen for hospital follow-up.  Patient is currently using 2 L of O2 with exertion.  He has been checking his O2 sats at home and states that they are staying the upper 90s.  We discussed that he cannot decrease his oxygen to 1 L/min with exertion and room air at rest as long as his sats remained above 93%.  Patient does continue to take Eliquis.  Patient does need to establish care with a new PCP. Denies f/c/s, n/v/d, hemoptysis, PND, chest pain or edema.   No Known Allergies   There is no immunization history on file for this patient.  History reviewed. No pertinent past medical history.  Tobacco History: Social History   Tobacco Use  Smoking Status Never Smoker  Smokeless Tobacco Never Used   Counseling given: Yes   Outpatient Encounter Medications as of 01/11/2021  Medication Sig  . apixaban (ELIQUIS) 5 MG TABS tablet Take 2 tablets (10 mg total) by mouth 2 (two) times daily for 3 days, THEN 1 tablet (5 mg total) 2 (two) times daily.  . benzonatate (TESSALON) 100 MG capsule Take 1 capsule (100 mg total) by mouth 3 (three) times daily as needed for cough.  . famotidine (PEPCID) 20 MG tablet Take 1 tablet (20 mg total) by mouth 2 (two) times daily.  03/11/2021 gabapentin (NEURONTIN) 100 MG capsule Take 2 capsules (200 mg total) by mouth 3 (three) times daily as needed for up to 7 days (left arm nerve pain).  Marland Kitchen guaiFENesin-dextromethorphan (ROBITUSSIN DM) 100-10 MG/5ML syrup Take 10 mLs by mouth every 4 (four) hours as needed for cough.  .  Ipratropium-Albuterol (COMBIVENT) 20-100 MCG/ACT AERS respimat Inhale 1 puff into the lungs every 6 (six) hours as needed for wheezing or shortness of breath.  . nystatin cream (MYCOSTATIN) Apply 1 application topically 2 (two) times daily.  . ondansetron (ZOFRAN ODT) 4 MG disintegrating tablet Take 1 tablet (4 mg total) by mouth every 8 (eight) hours as needed for nausea or vomiting.  . sodium chloride (OCEAN) 0.65 % SOLN nasal spray Place 1 spray into both nostrils as needed for congestion.   No facility-administered encounter medications on file as of 01/11/2021.     Review of Systems  Review of Systems  Constitutional: Negative.  Negative for fatigue and fever.  HENT: Negative.   Respiratory: Positive for shortness of breath. Negative for cough.   Cardiovascular: Negative.  Negative for chest pain, palpitations and leg swelling.  Gastrointestinal: Negative.   Allergic/Immunologic: Negative.   Neurological: Negative.   Psychiatric/Behavioral: Negative.        Physical Exam  BP 104/79   Pulse 90   Temp (!) 96.2 F (35.7 C)   SpO2 97% Comment: 2 L  Wt Readings from Last 5 Encounters:  12/19/20 200 lb 4.8 oz (90.9 kg)  12/07/20 188 lb (85.3 kg)     Physical Exam Vitals and nursing note reviewed.  Constitutional:      General: He is not in acute distress.  Appearance: He is well-developed and well-nourished.  Cardiovascular:     Rate and Rhythm: Normal rate and regular rhythm.  Pulmonary:     Effort: Pulmonary effort is normal.     Breath sounds: Normal breath sounds.  Musculoskeletal:     Right lower leg: No edema.     Left lower leg: No edema.  Skin:    General: Skin is warm and dry.  Neurological:     Mental Status: He is alert and oriented to person, place, and time.  Psychiatric:        Mood and Affect: Mood and affect and mood normal.        Behavior: Behavior normal.       Imaging: DG Chest 2 View  Result Date: 12/13/2020 CLINICAL DATA:  Left  chest pain and shortness of breath. COVID-19 positive patient. EXAM: CHEST - 2 VIEW COMPARISON:  None. FINDINGS: There is extensive bilateral airspace disease consistent with pneumonia. No pneumothorax or pleural effusion. Heart size is normal. IMPRESSION: Extensive multifocal pneumonia has an appearance most compatible with COVID-19 infection. Electronically Signed   By: Drusilla Kanner M.D.   On: 12/13/2020 15:07   CT ANGIO CHEST PE W OR WO CONTRAST  Result Date: 12/23/2020 CLINICAL DATA:  Increased shortness of breath, COVID-19 positive, positive D dimer EXAM: CT ANGIOGRAPHY CHEST WITH CONTRAST TECHNIQUE: Multidetector CT imaging of the chest was performed using the standard protocol during bolus administration of intravenous contrast. Multiplanar CT image reconstructions and MIPs were obtained to evaluate the vascular anatomy. CONTRAST:  OMNIPAQUE IOHEXOL 350 MG/ML SOLN COMPARISON:  12/14/2020 FINDINGS: Cardiovascular: This is a technically adequate evaluation of the pulmonary vasculature. Small segmental emboli are seen within the right upper, right middle, and left lower lobes. Clot burden is minimal, with no evidence of right heart strain. The heart is unremarkable without pericardial effusion. No evidence of thoracic aortic aneurysm or dissection. Mediastinum/Nodes: No enlarged mediastinal, hilar, or axillary lymph nodes. Thyroid gland, trachea, and esophagus demonstrate no significant findings. Lungs/Pleura: The multifocal bilateral airspace disease seen previously is again identified, with slight improvement in the interim. No effusion or pneumothorax. Central airways are patent. Upper Abdomen: No acute abnormality. Musculoskeletal: No acute or destructive bony lesions. Reconstructed images demonstrate no additional findings. Review of the MIP images confirms the above findings. IMPRESSION: 1. Bilateral segmental pulmonary emboli, with minimal clot burden. No evidence of right heart strain. 2.  Slight interval improvement in the multifocal bilateral COVID-19 pneumonia. Critical Value/emergent results were called by telephone at the time of interpretation on 12/23/2020 at 5:34 pm to provider Alford Highland , who verbally acknowledged these results. Electronically Signed   By: Sharlet Salina M.D.   On: 12/23/2020 17:34   CT ANGIO CHEST PE W OR WO CONTRAST  Result Date: 12/14/2020 CLINICAL DATA:  Chest pain and COVID EXAM: CT ANGIOGRAPHY CHEST WITH CONTRAST TECHNIQUE: Multidetector CT imaging of the chest was performed using the standard protocol during bolus administration of intravenous contrast. Multiplanar CT image reconstructions and MIPs were obtained to evaluate the vascular anatomy. CONTRAST:  OMNIPAQUE IOHEXOL 350 MG/ML SOLN COMPARISON:  None. FINDINGS: Cardiovascular: Slightly suboptimal opacification of the main pulmonary artery seen. No central or proximal segmental pulmonary embolism is noted. The heart is normal in size. No pericardial effusion or thickening. No evidence right heart strain. There is normal three-vessel brachiocephalic anatomy without proximal stenosis. The thoracic aorta is normal in appearance. Mediastinum/Nodes: No hilar, mediastinal, or axillary adenopathy. Thyroid gland, trachea, and esophagus demonstrate  no significant findings. Lungs/Pleura: Extensive multifocal patchy airspace opacities are seen throughout both lungs. Air bronchograms are seen within both lung bases. No pleural effusion or pneumothorax. Upper Abdomen: No acute abnormalities present in the visualized portions of the upper abdomen. Musculoskeletal: No chest wall abnormality. No acute or significant osseous findings. Review of the MIP images confirms the above findings. IMPRESSION: Slightly suboptimal opacification of the main pulmonary artery, however no central or proximal segmental pulmonary embolism. Extensive airspace opacities, consistent with multifocal atypical viral pneumonia.  Electronically Signed   By: Jonna Clark M.D.   On: 12/14/2020 19:27   US Venous Img Lower Bilateral (DVT)  Result Date: 12/20/2020 CLINICAL DATA:  Elevated D-dimer. Bilateral lower extremity pain. History of COVID-19 infection. Evaluate for DVT. EXAM: BILATERAL LOWER EXTREMITY VENOUS DOPPLER ULTRASOUND TECHNIQUE: Gray-scale sonography with graded compression, as well as color Doppler and duplex ultrasound were performed to evaluate the lower extremity deep venous systems from the level of the common femoral vein and including the common femoral, femoral, profunda femoral, popliteal and calf veins including the posterior tibial, peroneal and gastrocnemius veins when visible. The superficial great saphenous vein was also interrogated. Spectral Doppler was utilized to evaluate flow at rest and with distal augmentation maneuvers in the common femoral, femoral and popliteal veins. COMPARISON:  None. FINDINGS: RIGHT LOWER EXTREMITY Common Femoral Vein: No evidence of thrombus. Normal compressibility, respiratory phasicity and response to augmentation. Saphenofemoral Junction: No evidence of thrombus. Normal compressibility and flow on color Doppler imaging. Profunda Femoral Vein: No evidence of thrombus. Normal compressibility and flow on color Doppler imaging. Femoral Vein: No evidence of thrombus. Normal compressibility, respiratory phasicity and response to augmentation. Popliteal Vein: No evidence of thrombus. Normal compressibility, respiratory phasicity and response to augmentation. Calf Veins: No evidence of thrombus. Normal compressibility and flow on color Doppler imaging. Superficial Great Saphenous Vein: No evidence of thrombus. Normal compressibility. Venous Reflux:  None. Other Findings:  None. LEFT LOWER EXTREMITY Common Femoral Vein: No evidence of thrombus. Normal compressibility, respiratory phasicity and response to augmentation. Saphenofemoral Junction: No evidence of thrombus. Normal  compressibility and flow on color Doppler imaging. Profunda Femoral Vein: No evidence of thrombus. Normal compressibility and flow on color Doppler imaging. Femoral Vein: No evidence of thrombus. Normal compressibility, respiratory phasicity and response to augmentation. Popliteal Vein: No evidence of thrombus. Normal compressibility, respiratory phasicity and response to augmentation. Calf Veins: No evidence of thrombus. Normal compressibility and flow on color Doppler imaging. Superficial Great Saphenous Vein: No evidence of thrombus. Normal compressibility. Venous Reflux:  None. Other Findings:  None. IMPRESSION: No evidence of DVT within either lower extremity. Electronically Signed   By: Simonne Come M.D.   On: 12/20/2020 16:13     Assessment & Plan:   Pneumonia due to COVID-19 virus Respiratory failure:   Stay well hydrated  Stay active  Deep breathing exercises  May take tylenol or fever or pain  May take mucinex twice daily  Will order chest x ray:  [facility] [address] [place], [place] [address] [phone] MON - FRI 8:00 AM - 4:00 PM - WALK IN  Will place referral for pulmonary / [place] - patient request due to transportation issues  Continue Eliquis  May wean O2 to keep sats above 93%   Follow up:  Follow up if needed      Ivonne Andrew, NP 01/11/2021

## 2021-01-15 ENCOUNTER — Telehealth: Payer: Self-pay | Admitting: General Practice

## 2021-01-15 NOTE — Telephone Encounter (Signed)
Called on 2/15 to see if they still wanted to become a patient. Voicemail box was full so I was unable to leave a message.

## 2021-01-18 ENCOUNTER — Encounter: Payer: Self-pay | Admitting: Internal Medicine

## 2021-01-18 ENCOUNTER — Other Ambulatory Visit
Admission: RE | Admit: 2021-01-18 | Discharge: 2021-01-18 | Disposition: A | Discharge status: Home / Self Care | Payer: Self-pay | Source: Ambulatory Visit | Attending: Internal Medicine | Admitting: Internal Medicine

## 2021-01-18 ENCOUNTER — Other Ambulatory Visit: Payer: Self-pay

## 2021-01-18 ENCOUNTER — Ambulatory Visit (INDEPENDENT_AMBULATORY_CARE_PROVIDER_SITE_OTHER): Payer: Self-pay | Admitting: Internal Medicine

## 2021-01-18 ENCOUNTER — Telehealth: Payer: Self-pay | Admitting: Internal Medicine

## 2021-01-18 ENCOUNTER — Other Ambulatory Visit: Payer: Self-pay | Admitting: Internal Medicine

## 2021-01-18 VITALS — BP 110/72 | HR 83 | Ht 72.0 in | Wt 193.2 lb

## 2021-01-18 DIAGNOSIS — Z5989 Other problems related to housing and economic circumstances: Secondary | ICD-10-CM

## 2021-01-18 DIAGNOSIS — U071 COVID-19: Secondary | ICD-10-CM | POA: Insufficient documentation

## 2021-01-18 DIAGNOSIS — R739 Hyperglycemia, unspecified: Secondary | ICD-10-CM

## 2021-01-18 DIAGNOSIS — N179 Acute kidney failure, unspecified: Secondary | ICD-10-CM

## 2021-01-18 DIAGNOSIS — I2699 Other pulmonary embolism without acute cor pulmonale: Secondary | ICD-10-CM

## 2021-01-18 DIAGNOSIS — Z8616 Personal history of COVID-19: Secondary | ICD-10-CM

## 2021-01-18 DIAGNOSIS — Z8709 Personal history of other diseases of the respiratory system: Secondary | ICD-10-CM | POA: Insufficient documentation

## 2021-01-18 DIAGNOSIS — L732 Hidradenitis suppurativa: Secondary | ICD-10-CM

## 2021-01-18 LAB — D-DIMER, QUANTITATIVE: D-Dimer, Quant: 0.83 ug/mL-FEU — ABNORMAL HIGH (ref 0.00–0.50)

## 2021-01-18 LAB — BASIC METABOLIC PANEL
Anion gap: 7 (ref 5–15)
BUN: 20 mg/dL (ref 6–20)
CO2: 27 mmol/L (ref 22–32)
Calcium: 9.3 mg/dL (ref 8.9–10.3)
Chloride: 105 mmol/L (ref 98–111)
Creatinine, Ser: 1.45 mg/dL — ABNORMAL HIGH (ref 0.61–1.24)
GFR, Estimated: >60 mL/min (ref >60)
Glucose, Bld: 107 mg/dL — ABNORMAL HIGH (ref 70–99)
Potassium: 4 mmol/L (ref 3.5–5.1)
Sodium: 139 mmol/L (ref 135–145)

## 2021-01-18 LAB — CBC
HCT: 42.5 % (ref 39.0–52.0)
Hemoglobin: 14.4 g/dL (ref 13.0–17.0)
MCH: 31.6 pg (ref 26.0–34.0)
MCHC: 33.9 g/dL (ref 30.0–36.0)
MCV: 93.2 fL (ref 80.0–100.0)
Platelets: 358 10*3/uL (ref 150–400)
RBC: 4.56 MIL/uL (ref 4.22–5.81)
RDW: 14.4 % (ref 11.5–15.5)
WBC: 6.6 10*3/uL (ref 4.0–10.5)
nRBC: 0 % (ref 0.0–0.2)

## 2021-01-18 MED ORDER — DOXYCYCLINE HYCLATE 100 MG PO TABS: 100.0000 mg | ORAL_TABLET | Freq: Two times a day (BID) | ORAL | 0 refills | Status: AC

## 2021-01-18 NOTE — Patient Instructions (Addendum)
ICD-10-CM   1. History of acute respiratory failure  Z87.09 D-dimer, quantitative    CBC w/Diff    Basic metabolic panel    HgB A1c    QuantiFERON-TB Gold Plus  2. History of 2019 novel coronavirus disease (COVID-19)  Z86.16 D-dimer, quantitative  3. Pulmonary embolism associated with COVID-19 (HCC)  U07.1 D-dimer, quantitative   I26.99 CBC w/Diff    Basic metabolic panel    HgB A1c    QuantiFERON-TB Gold Plus  4. Hidradenitis  L73.2   5. Hyperglycemia  R73.9   6. Uninsured  Z59.89     History of acute respiratory failure History of 2019 novel coronavirus disease (COVID-19)  - o2 needs seems resolving/resolved  Plan  - monitor finger pulse ox while climbing stairs  - if stay over 92% consistently , then return o2  Pulmonary embolism associated with COVID-19 (HCC)- sufffered 12/23/20 with covid  - better but you need to be on blood thinner for 6 month minimum  Plan  - continue elqiuis  - avoid bleeding - check d-dimer 01/18/2021   Hidradenitis R groing Hyperglycemia  - you hae this condition in your right groin. Is an infection commonly associated with high sugars.  - your sugars were high in the hospital  Plan - this rquires PCP tratment but understand you are uninsured and do not have PCP - check cbc, bmet, HgbA1c, Quantiferon Gold  01/18/2021 - Take doxycycline 100mg  po twice daily x 14 days; take after meals and avoid sunlight - if not better go to urgent care or get a PCP (I am not a primary care doc and treating you for this out of good faith)   AKi due to covid in Jan 2022  - was imprving  Plan  - recheck bmet  Followup  - 6 months - clinical followup - check repeat d-dimer to decide about eliquis

## 2021-01-18 NOTE — Telephone Encounter (Signed)
Patient requested that I contacted his sister, Kathie Rhodes and discuss today's visit with her.  I have spoken to [facility] and reviewed AVS instructions. She voiced her understanding and had no further questions.  Nothing further needed.

## 2021-01-18 NOTE — Progress Notes (Signed)
OV 01/18/2021  Subjective:  Patient ID: Luis Dyer, male , DOB: [birth year] , age 45 y.o. , MRN: [id number] , ADDRESS: [address] [place] [address] PCP Patient, No Pcp Per Patient Care Team: Patient, No Pcp Per as PCP - General (General Practice)  This Provider for this visit: Treatment Team:  Attending Provider: Kalman Shan, MD    01/18/2021 -   Chief Complaint  Patient presents with  . pulmonary consult    Per Angus Seller, NP--recent admission 12/13/20.  Discharged on 4L. Currently wearing 2L at night and 1L with exertion. No current sx.     HPI Luis Dyer 45 y.o. -works as a Production designer, theatre/television/film at [employer].  Between December 13, 2020 and December 27, 2020 he was admitted for COVID-19.  His original Covid diagnosis was on December 07, 2020.  He ended up getting Covid pneumonia and pulmonary embolism without DVT.  He also seem to have had hyperglycemia on review of the records and a slightly elevated hemoglobin A1c of 6.0 and acute kidney injury.  He was discharged on 4 L oxygen.  He saw a nurse practitioner at the [facility] Angus Seller January 04, 2021.  By this time he had improved and is only requiring 2 L nasal cannula.  He has been referred here for follow-up.  Today he feels even better.  185 feet x 3 laps on room air pulse ox was 96% heart rate was 96.  He walked and his pulse ox dropped to 93% but still adequate heart rate stayed at 96.  He says he has no evidence of long-haul Covid.  No brain fog no palpitations no chest pains no tiredness.  No fatigue no shortness of breath no cough no wheeze no orthopnea no proximal nocturnal dyspnea.  No nausea vomiting diarrhea no loss of taste.  No loss of smell.  He is back to work.  He prefers to keep his oxygen for a while because he says when he climbs stairs he still a little bit short of breath.  He continues on Eliquis.  Labs in the hospital were reviewed and are documented below.  Of note he has a new  problem within the last week he has developed a swelling in his right groin.  I look at it and it looks like stage I hidradenitis suppurativa.  I asked him to go see a primary care physician because this is not under a pulmonologist purview.  However he said that he does not have a primary care physician he is uninsured.  He wants me to treat it.  He is applying some cream according to the CMA it is nystatin cream.   Of note patient at discharge wanted the CMA to go over the discharge instructions with the sister over phone.  Delray Alt was able to do this.   Results for Luis, Dyer (MRN [id number]) as of 01/18/2021 09:18  Ref. Range 12/20/2020 04:31  D-Dimer, Quant Latest Ref Range: 0.00 - 0.50 ug/mL-FEU 9.91 (H)   Results for Luis, Dyer (MRN [id number]) as of 01/18/2021 09:18  Ref. Range 12/07/2020 10:03 12/13/2020 14:27 12/13/2020 16:29 12/14/2020 04:13 12/15/2020 05:08 12/16/2020 04:43 12/17/2020 05:00 12/18/2020 04:32 12/20/2020 04:31 12/21/2020 04:47 12/22/2020 04:55 12/23/2020 07:06 12/24/2020 05:27 12/25/2020 06:30 12/27/2020 05:00  Creatinine Latest Ref Range: 0.61 - 1.24 mg/dL 5.40 (H) 0.86 (H)  7.61 (H) 1.38 (H)    1.74 (H) 1.47 (H) 1.24 1.16 1.27 (H) 1.33 (H) 1.27 (H)  Results for Luis, Dyer (MRN [id number]) as of 01/18/2021 09:18  Ref. Range 12/07/2020 10:03 12/13/2020 14:27 12/13/2020 16:29 12/14/2020 04:13 12/15/2020 05:08 12/16/2020 04:43 12/17/2020 05:00 12/18/2020 04:32 12/20/2020 04:31 12/21/2020 04:47 12/22/2020 04:55 12/23/2020 07:06 12/24/2020 05:27 12/25/2020 06:30 12/27/2020 05:00  Hemoglobin Latest Ref Range: 13.0 - 17.0 g/dL 80.9 (H) 98.3  38.2 50.5    16.4    14.1  13.2   IMPRESSION: No evidence of DVT within either lower extremity.   Electronically Signed   By: Simonne Come M.D.   On: 12/20/2020 16:13   CTangi PE  IMPRESSION: 1. Bilateral segmental pulmonary emboli, with minimal clot burden. No evidence of right heart strain. 2. Slight interval improvement in the  multifocal bilateral COVID-19 pneumonia.  Critical Value/emergent results were called by telephone at the time of interpretation on 12/23/2020 at 5:34 pm to provider Alford Highland , who verbally acknowledged these results.   Electronically Signed   By: Sharlet Salina M.D.   On: 12/23/2020 17:34   COVID PCR  mo ago    SARS Coronavirus 2 12/07/20 NEGATIVE POSITIVEAbnormal        has no past medical history on file.   reports that he has never smoked. He has never used smokeless tobacco.  Past Surgical History:  Procedure Laterality Date  . FACIAL FRACTURE SURGERY      No Known Allergies   There is no immunization history on file for this patient.  No family history on file.   Current Outpatient Medications:  .  apixaban (ELIQUIS) 5 MG TABS tablet, Take 2 tablets (10 mg total) by mouth 2 (two) times daily for 3 days, THEN 1 tablet (5 mg total) 2 (two) times daily., Disp: 72 tablet, Rfl: 0 .  benzonatate (TESSALON) 100 MG capsule, Take 1 capsule (100 mg total) by mouth 3 (three) times daily as needed for cough., Disp: 20 capsule, Rfl: 0 .  doxycycline (VIBRA-TABS) 100 MG tablet, Take 1 tablet (100 mg total) by mouth 2 (two) times daily., Disp: 28 tablet, Rfl: 0 .  famotidine (PEPCID) 20 MG tablet, Take 1 tablet (20 mg total) by mouth 2 (two) times daily., Disp: 60 tablet, Rfl: 0 .  guaiFENesin-dextromethorphan (ROBITUSSIN DM) 100-10 MG/5ML syrup, Take 10 mLs by mouth every 4 (four) hours as needed for cough., Disp: 118 mL, Rfl: 0 .  Ipratropium-Albuterol (COMBIVENT) 20-100 MCG/ACT AERS respimat, Inhale 1 puff into the lungs every 6 (six) hours as needed for wheezing or shortness of breath., Disp: 4 g, Rfl: 1 .  nystatin cream (MYCOSTATIN), Apply 1 application topically 2 (two) times daily., Disp: 30 g, Rfl: 0 .  ondansetron (ZOFRAN ODT) 4 MG disintegrating tablet, Take 1 tablet (4 mg total) by mouth every 8 (eight) hours as needed for nausea or vomiting., Disp: 20  tablet, Rfl: 0 .  sodium chloride (OCEAN) 0.65 % SOLN nasal spray, Place 1 spray into both nostrils as needed for congestion., Disp: 30 mL, Rfl: 0 .  gabapentin (NEURONTIN) 100 MG capsule, Take 2 capsules (200 mg total) by mouth 3 (three) times daily as needed for up to 7 days (left arm nerve pain)., Disp: 21 capsule, Rfl: 0      Objective:   Vitals:   01/18/21 0904  BP: 110/72  Pulse: 83  SpO2: 97%  Weight: 193 lb 3.2 oz (87.6 kg)  Height: 6' (1.829 m)    Estimated body mass index is 26.2 kg/m as calculated from the following:   Height as of this encounter:  6' (1.829 m).   Weight as of this encounter: 193 lb 3.2 oz (87.6 kg).  @WEIGHTCHANGE @  [employer] Power[employer]iled Weights   01/18/21 0904  Weight: 193 lb 3.2 oz (87.6 kg)     Physical Exam  General Appearance:    Alert, cooperative, no distress, appears stated age - looks well , Deconditioned looking - no , OBESE  - no, Sitting on Wheelchair -  no  Head:    Normocephalic, without obvious abnormality, atraumatic  Eyes:    PERRL, conjunctiva/corneas clear,  Ears:    Normal TM's and external ear canals, both ears  Nose:   Nares normal, septum midline, mucosa normal, no drainage    or sinus tenderness. OXYGEN ON  - no . Patient is @ ra   Throat:   Lips, mucosa, and tongue normal; teeth and gums normal. Cyanosis on lips - no  Neck:   Supple, symmetrical, trachea midline, no adenopathy;    thyroid:  no enlargement/tenderness/nodules; no carotid   bruit or JVD  Back:     Symmetric, no curvature, ROM normal, no CVA tenderness  Lungs:     Distress - no , Wheeze no, Barrell Chest - no, Purse lip breathing - no, Crackles - no   Chest Wall:    No tenderness or deformity.    Heart:    Regular rate and rhythm, S1 and S2 normal, no rub   or gallop, Murmur - nno  Breast Exam:    NOT DONE  Abdomen:     Soft, non-tender, bowel sounds active all four quadrants,    no masses, no organomegaly. Visceral obesity - no  Genitalia:   NOT DONE  Rectal:   NOT  DONE  Extremities:   Extremities - normal, Has Cane - no, Clubbing - no, Edema - no  Pulses:   2+ and symmetric all extremities  Skin:   Stigmata of Connective Tissue Disease - no  Lymph nodes:   Cervical, supraclavicular, and axillary nodes normal  Psychiatric:  Neurologic:   Pleasant - yes, Anxious - no, Flat affect - no  CAm-ICU - neg, Alert and Oriented x 3 - yes, Moves all 4s - yes, Speech - normal, Cognition - intact       Assessment:       ICD-10-CM   1. History of acute respiratory failure  Z87.09 D-dimer, quantitative    CBC w/Diff    Basic metabolic panel    HgB A1c    QuantiFERON-TB Gold Plus  2. History of 2019 novel coronavirus disease (COVID-19)  Z86.16 D-dimer, quantitative  3. Pulmonary embolism associated with COVID-19 (HCC)  U07.1 D-dimer, quantitative   I26.99 CBC w/Diff    Basic metabolic panel    HgB A1c    QuantiFERON-TB Gold Plus  4. AKI (acute kidney injury) (HCC)  N17.9   5. Hidradenitis  L73.2   6. Hyperglycemia  R73.9   7. Uninsured  Z59.89        Plan:     Patient Instructions     ICD-10-CM   1. History of acute respiratory failure  Z87.09 D-dimer, quantitative    CBC w/Diff    Basic metabolic panel    HgB A1c    QuantiFERON-TB Gold Plus  2. History of 2019 novel coronavirus disease (COVID-19)  Z86.16 D-dimer, quantitative  3. Pulmonary embolism associated with COVID-19 (HCC)  U07.1 D-dimer, quantitative   I26.99 CBC w/Diff    Basic metabolic panel    HgB A1c    QuantiFERON-TB Gold Plus  4. Hidradenitis  L73.2   5. Hyperglycemia  R73.9   6. Uninsured  Z59.89     History of acute respiratory failure History of 2019 novel coronavirus disease (COVID-19)  - o2 needs seems resolving/resolved  Plan  - monitor finger pulse ox while climbing stairs  - if stay over 92% consistently , then return o2  Pulmonary embolism associated with COVID-19 (HCC)- sufffered 12/23/20 with covid  - better but you need to be on blood thinner for 6 month  minimum  Plan  - continue elqiuis  - avoid bleeding - check d-dimer 01/18/2021   Hidradenitis R groing Hyperglycemia  - you hae this condition in your right groin. Is an infection commonly associated with high sugars.  - your sugars were high in the hospital  Plan - this rquires PCP tratment but understand you are uninsured and do not have PCP - check cbc, bmet, HgbA1c, Quantiferon Gold  01/18/2021 - Take doxycycline 100mg  po twice daily x 14 days; take after meals and avoid sunlight - if not better go to urgent care or get a PCP (I am not a primary care doc and treating you for this out of good faith)   AKi due to covid in Jan 2022  - was imprving  Plan  - recheck bmet  Followup  - 6 months - clinical followup - check repeat d-dimer to decide about eliquis      SIGNATURE    Dr. Feb 2022, M.D., F.C.C.P,  Pulmonary and Critical Care Medicine Staff Physician, [facility] Center Director - Interstitial Lung Disease  Program  Pulmonary Fibrosis [facility] at [facility] [place], [place], [place]  Pager: [phone], If no answer or between  15:00h - 7:00h: call [phone] Telephone: [phone]  9:45 AM 01/18/2021

## 2021-01-19 LAB — HEMOGLOBIN A1C
Hgb A1c MFr Bld: 6.3 % — ABNORMAL HIGH (ref 4.8–5.6)
Mean Plasma Glucose: 134 mg/dL

## 2021-01-20 LAB — QUANTIFERON-TB GOLD PLUS (RQFGPL)
QuantiFERON Mitogen Value: 6.11 IU/mL
QuantiFERON Nil Value: 0.06 IU/mL
QuantiFERON TB1 Ag Value: 0.07 IU/mL
QuantiFERON TB2 Ag Value: 0.07 IU/mL

## 2021-01-20 LAB — QUANTIFERON-TB GOLD PLUS: QuantiFERON-TB Gold Plus: NEGATIVE

## 2021-03-16 IMAGING — CT CT ANGIO CHEST
2 of 6 series · 18 of 46 positions shown · IV contrast (APPLIED)
Comparison: None.

CLINICAL DATA: Chest pain and COVID

EXAM:
CT ANGIOGRAPHY CHEST WITH CONTRAST
TECHNIQUE: Multidetector CT imaging of the chest was performed using the
standard protocol during bolus administration of intravenous
contrast. Multiplanar CT image reconstructions and MIPs were
obtained to evaluate the vascular anatomy.
CONTRAST:  100mL OMNIPAQUE IOHEXOL 350 MG/ML SOLN

[Series 5: thins · axial · 0.78mm/px · z∈[+306,+572]mm · 16 of 292 slices shown]
[im 13/292  lung]
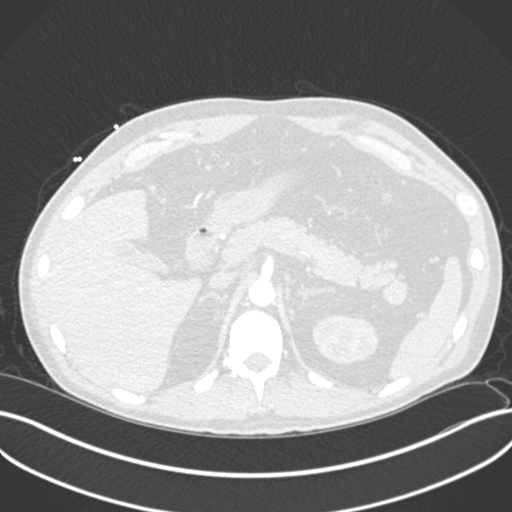
[im 38/292  soft-tissue]
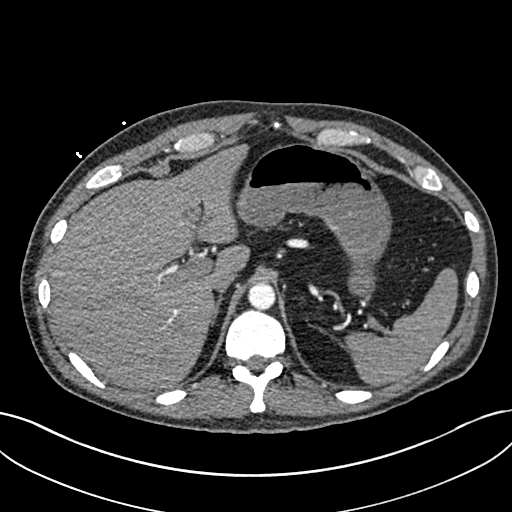
[im 51/292  lung]
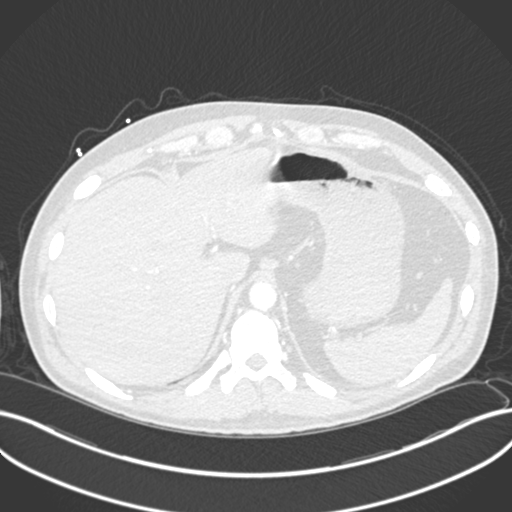
[im 64/292  soft-tissue]
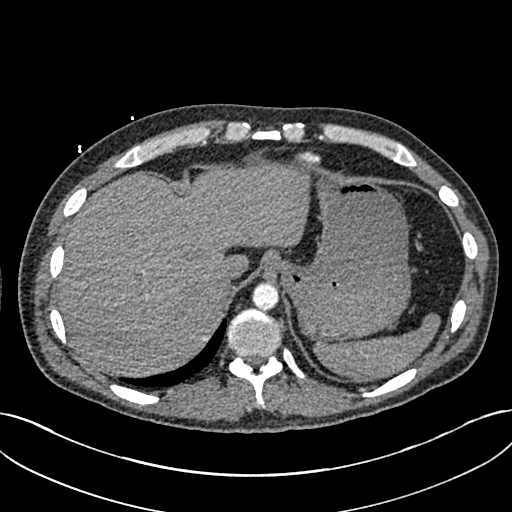
[im 89/292  lung]
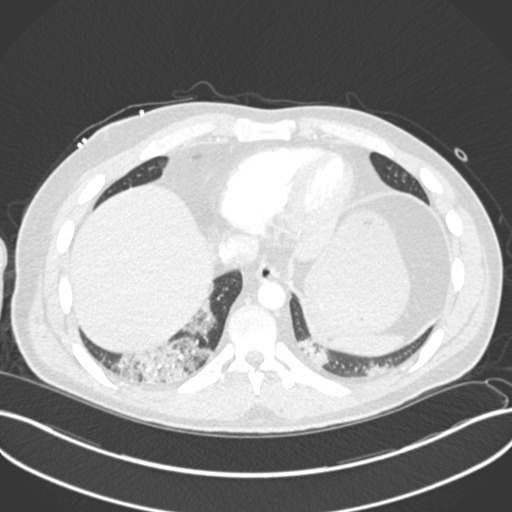
[im 102/292  soft-tissue]
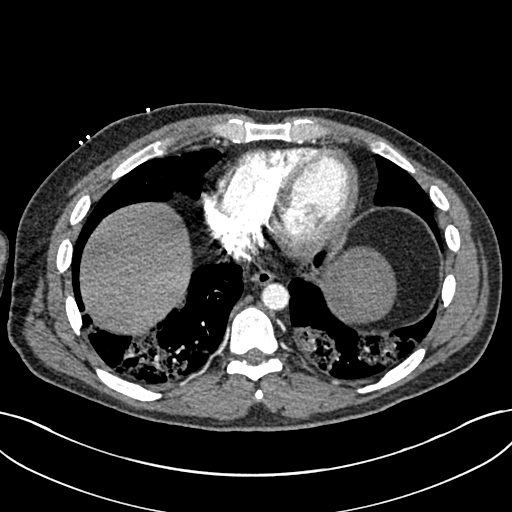
[im 114/292  lung]
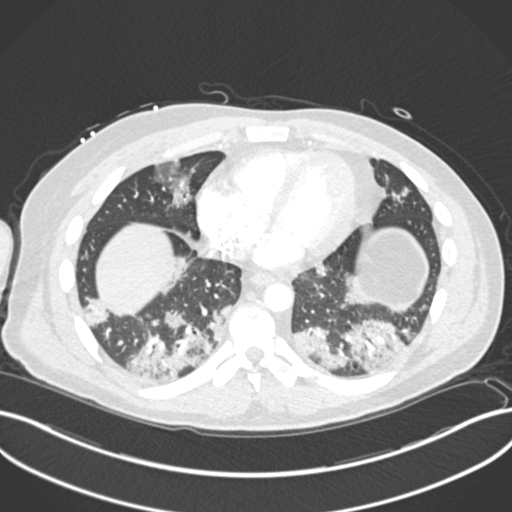
[im 140/292  soft-tissue]
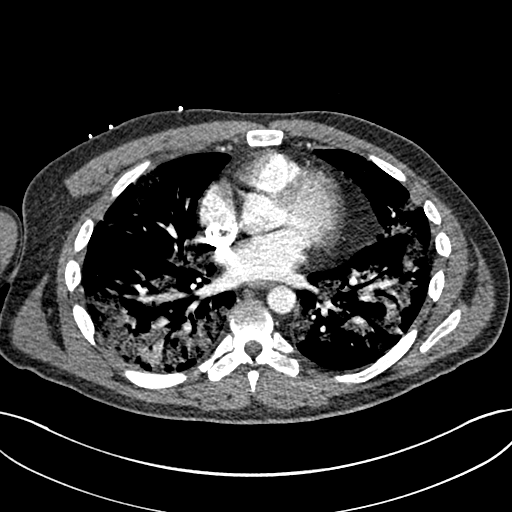
[im 152/292  lung]
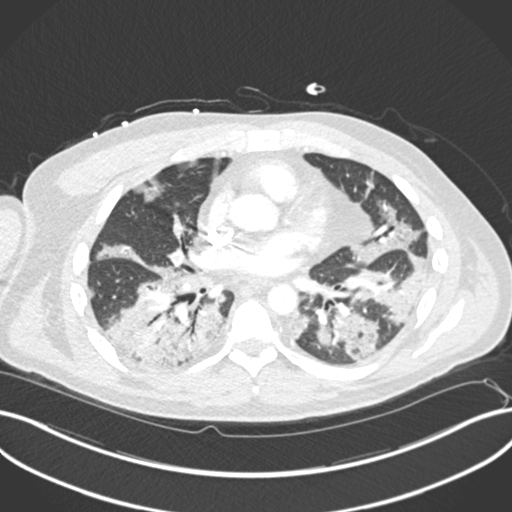
[im 178/292  soft-tissue]
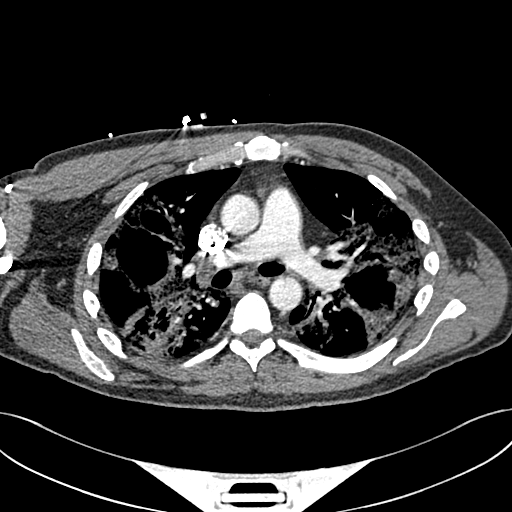
[im 190/292  lung]
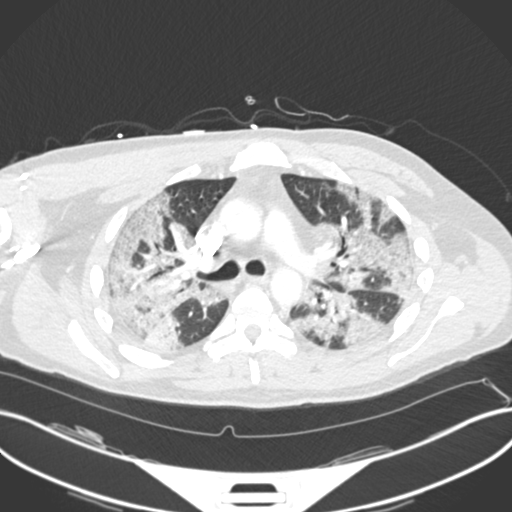
[im 203/292  soft-tissue]
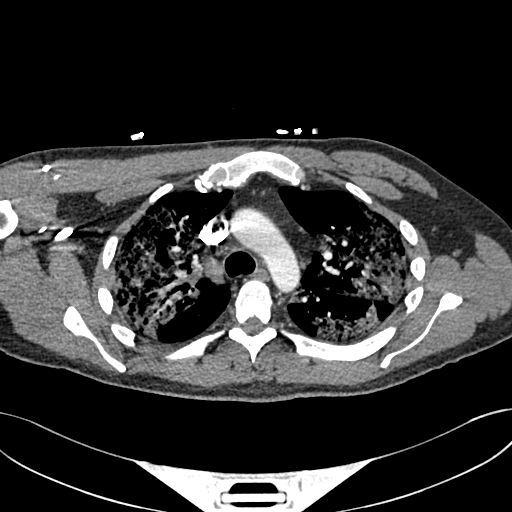
[im 228/292  lung]
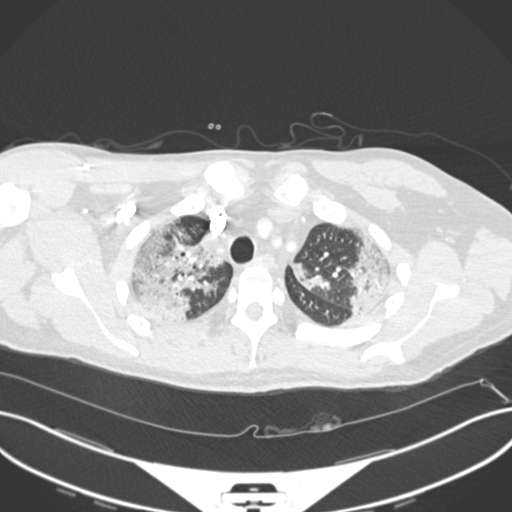
[im 241/292  soft-tissue]
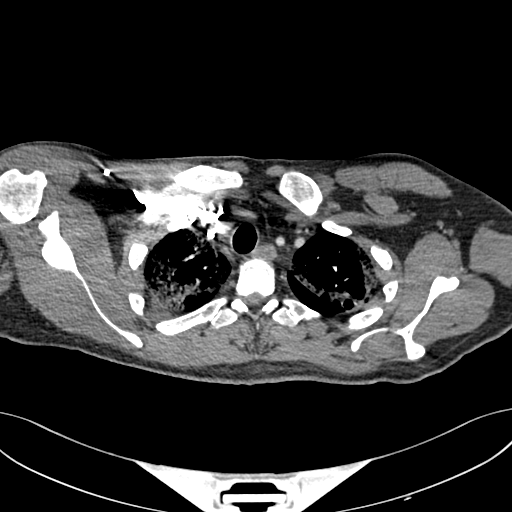
[im 254/292  lung]
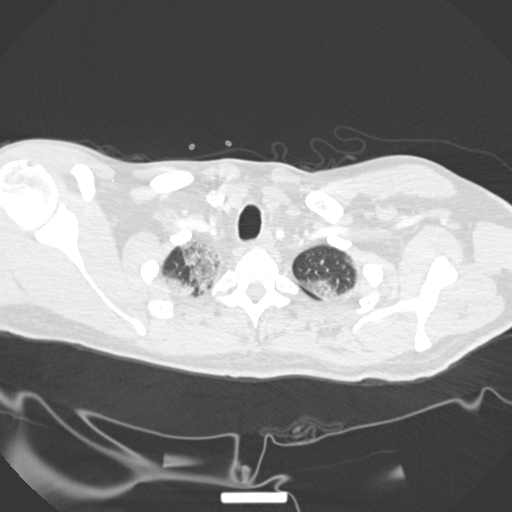
[im 279/292  soft-tissue]
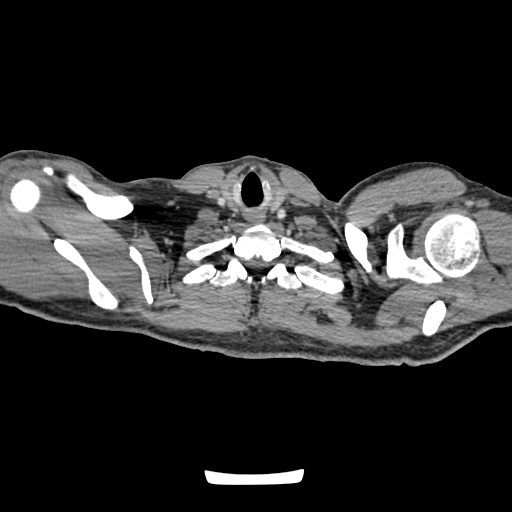

[Series 7: coronal mpr · coronal · 0.59mm/px · 2 of 85 slices shown]
[im 29/85  soft-tissue]
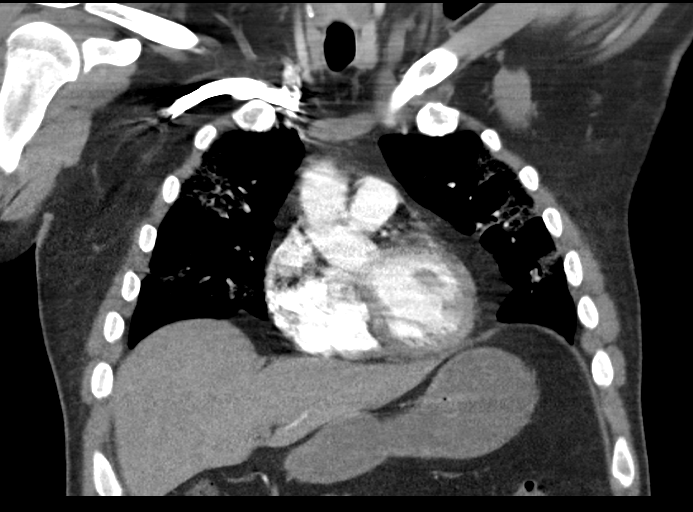
[im 57/85  soft-tissue]
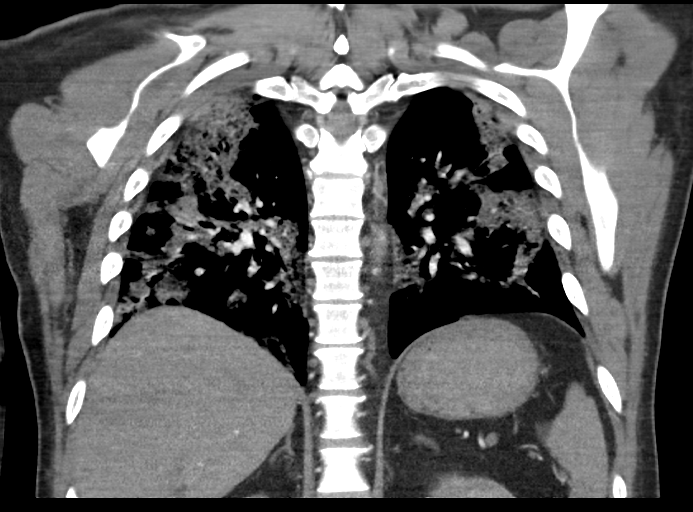

[18 of 46 positions shown; findings below may reference images not displayed]

FINDINGS: Cardiovascular: Slightly suboptimal opacification of the main
pulmonary artery seen. No central or proximal segmental pulmonary
embolism is noted. The heart is normal in size. No pericardial
effusion or thickening. No evidence right heart strain. There is
normal three-vessel brachiocephalic anatomy without proximal
stenosis. The thoracic aorta is normal in appearance.

Mediastinum/Nodes: No hilar, mediastinal, or axillary adenopathy.
Thyroid gland, trachea, and esophagus demonstrate no significant
findings.

Lungs/Pleura: Extensive multifocal patchy airspace opacities are
seen throughout both lungs. Air bronchograms are seen within both
lung bases. No pleural effusion or pneumothorax.

Upper Abdomen: No acute abnormalities present in the visualized
portions of the upper abdomen.

Musculoskeletal: No chest wall abnormality. No acute or significant
osseous findings.

Review of the MIP images confirms the above findings.
IMPRESSION: Slightly suboptimal opacification of the main pulmonary artery,
however no central or proximal segmental pulmonary embolism.

Extensive airspace opacities, consistent with multifocal atypical
viral pneumonia.

## 2021-03-22 IMAGING — US US EXTREM LOW VENOUS
1 series · 13 of 24 positions shown · non-contrast
Comparison: None.

CLINICAL DATA: Elevated D-dimer. Bilateral lower extremity pain.
History of [id number]-81 infection. Evaluate for DVT.



[Series 1: us extrem low venous · 0.07mm/px · 13 of 58 slices shown]
[im 1/58]
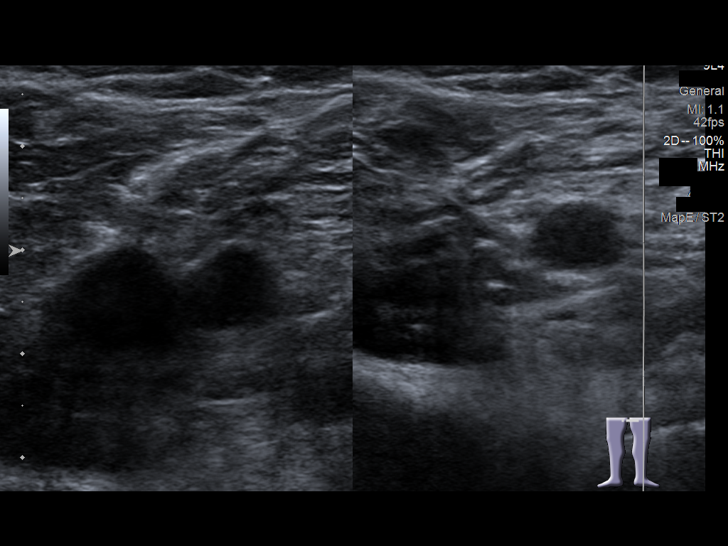
[im 5/58]
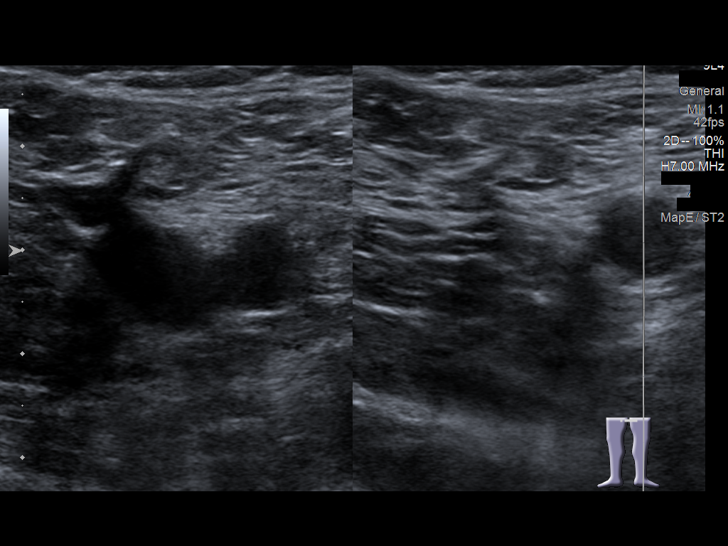
[im 10/58]
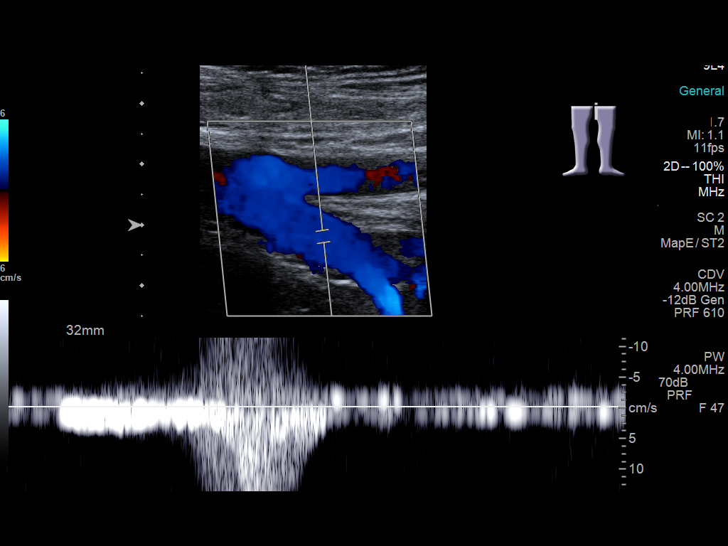
[im 15/58]
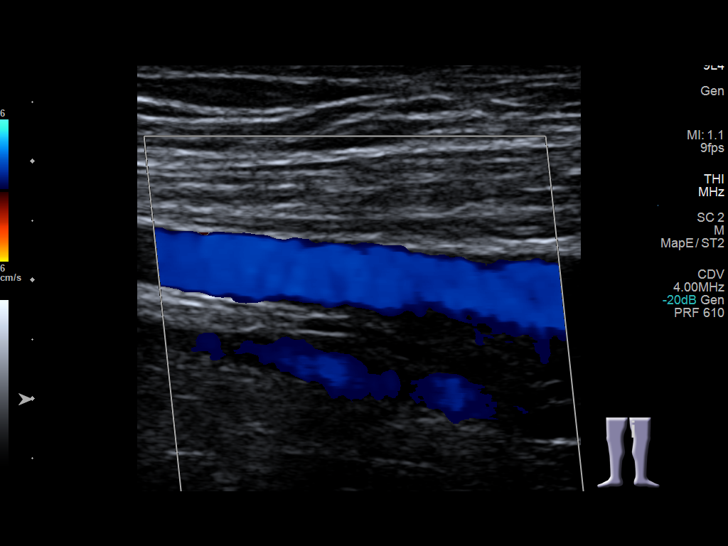
[im 20/58]
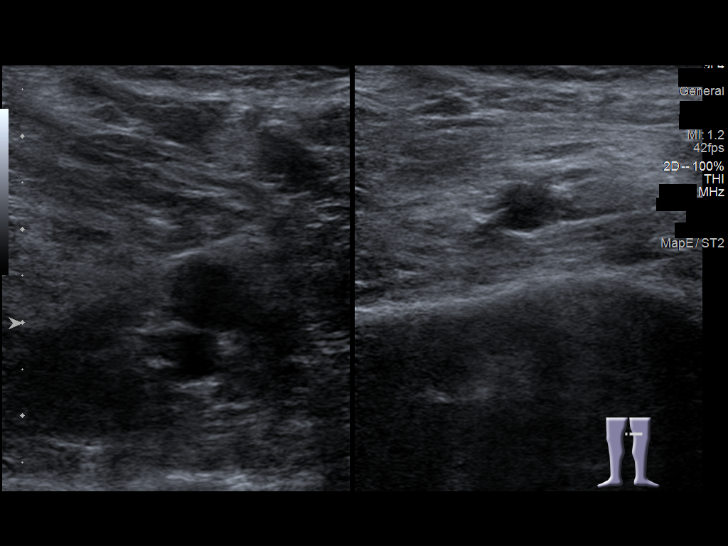
[im 25/58]
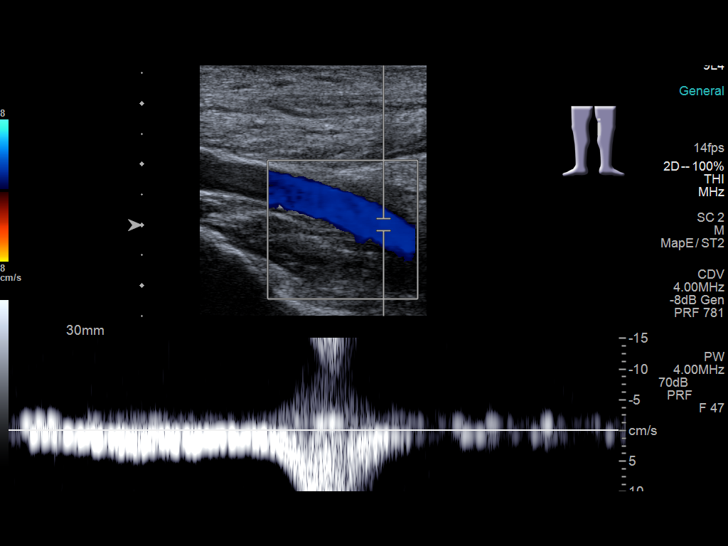
[im 30/58]
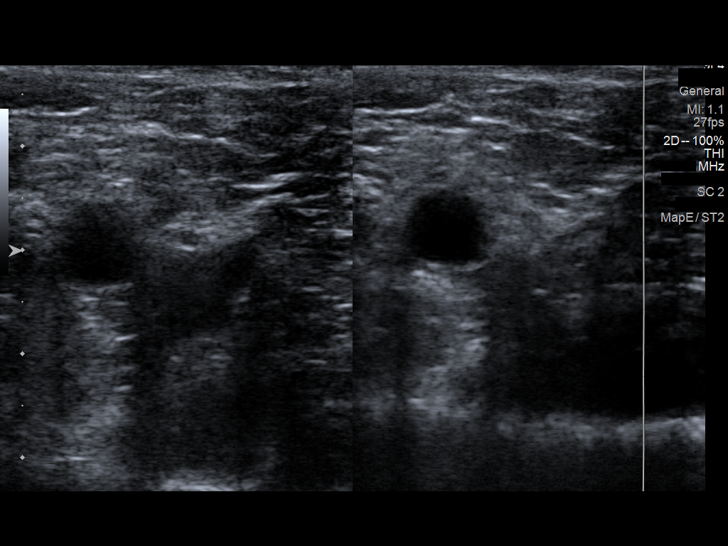
[im 33/58]
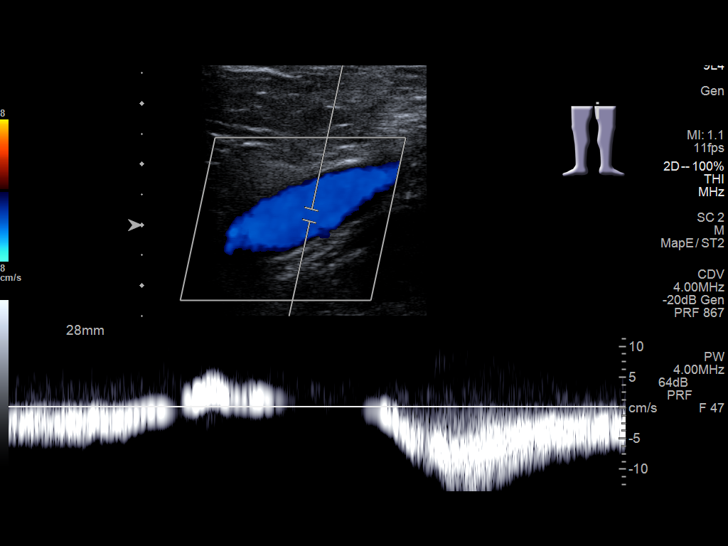
[im 38/58]
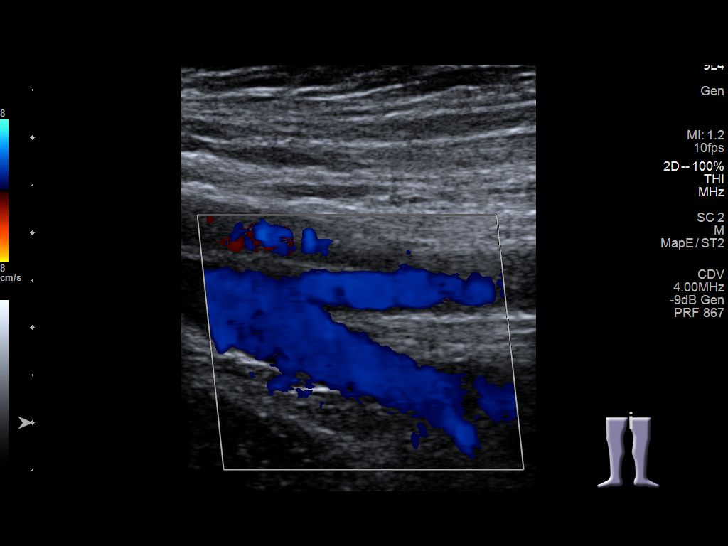
[im 43/58]
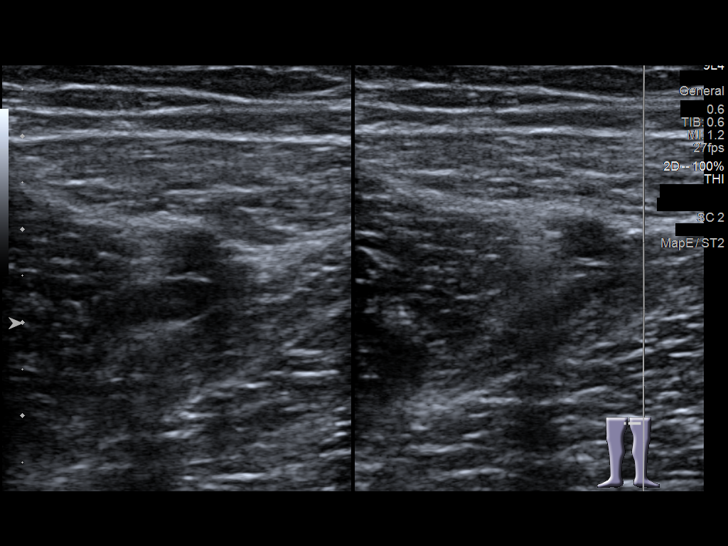
[im 48/58]
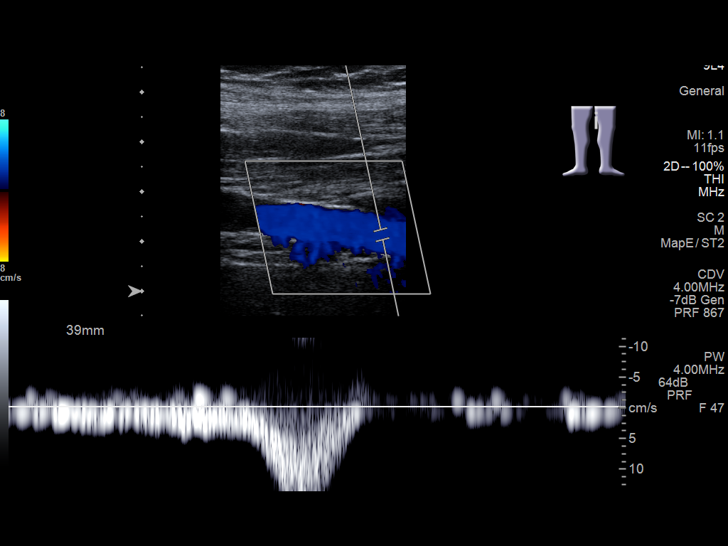
[im 53/58]
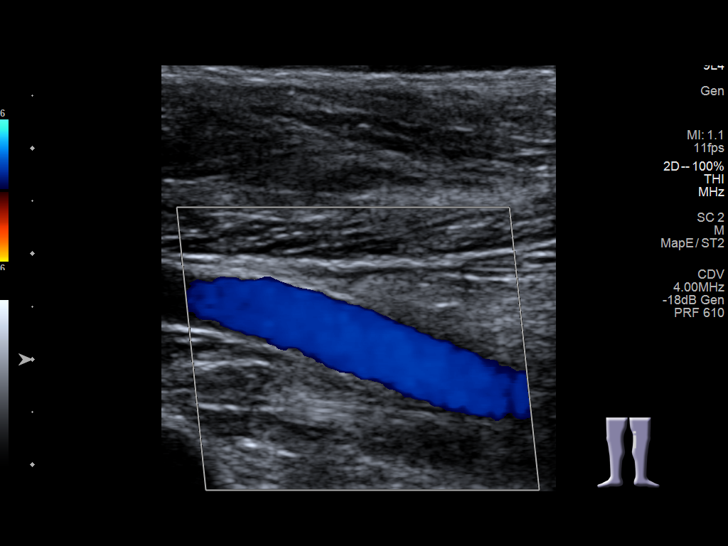
[im 58/58]
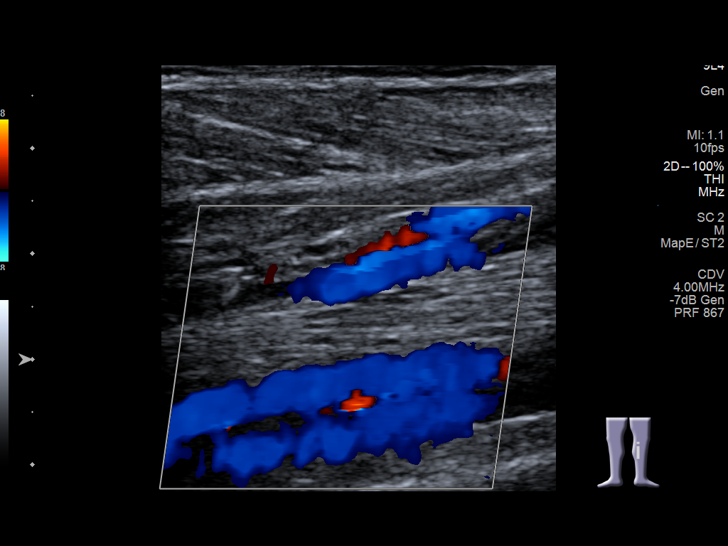

[13 of 24 positions shown; findings below may reference images not displayed]

FINDINGS: RIGHT LOWER EXTREMITY

Common Femoral Vein: No evidence of thrombus. Normal
compressibility, respiratory phasicity and response to augmentation.

Saphenofemoral Junction: No evidence of thrombus. Normal
compressibility and flow on color Doppler imaging.

Profunda Femoral Vein: No evidence of thrombus. Normal
compressibility and flow on color Doppler imaging.

Femoral Vein: No evidence of thrombus. Normal compressibility,
respiratory phasicity and response to augmentation.

Popliteal Vein: No evidence of thrombus. Normal compressibility,
respiratory phasicity and response to augmentation.

Calf Veins: No evidence of thrombus. Normal compressibility and flow
on color Doppler imaging.

Superficial Great Saphenous Vein: No evidence of thrombus. Normal
compressibility.

Venous Reflux:  None.

Other Findings:  None.

LEFT LOWER EXTREMITY

Common Femoral Vein: No evidence of thrombus. Normal
compressibility, respiratory phasicity and response to augmentation.

Saphenofemoral Junction: No evidence of thrombus. Normal
compressibility and flow on color Doppler imaging.

Profunda Femoral Vein: No evidence of thrombus. Normal
compressibility and flow on color Doppler imaging.

Femoral Vein: No evidence of thrombus. Normal compressibility,
respiratory phasicity and response to augmentation.

Popliteal Vein: No evidence of thrombus. Normal compressibility,
respiratory phasicity and response to augmentation.

Calf Veins: No evidence of thrombus. Normal compressibility and flow
on color Doppler imaging.

Superficial Great Saphenous Vein: No evidence of thrombus. Normal
compressibility.

Venous Reflux:  None.

Other Findings:  None.
IMPRESSION: No evidence of DVT within either lower extremity.

## 2021-04-13 IMAGING — CR DG CHEST 2V
2 series · 2 of 2 positions shown · non-contrast
Comparison: 12/13/2020

CLINICAL DATA: COVID pneumonia

EXAM:
CHEST - 2 VIEW

[w chest pa]
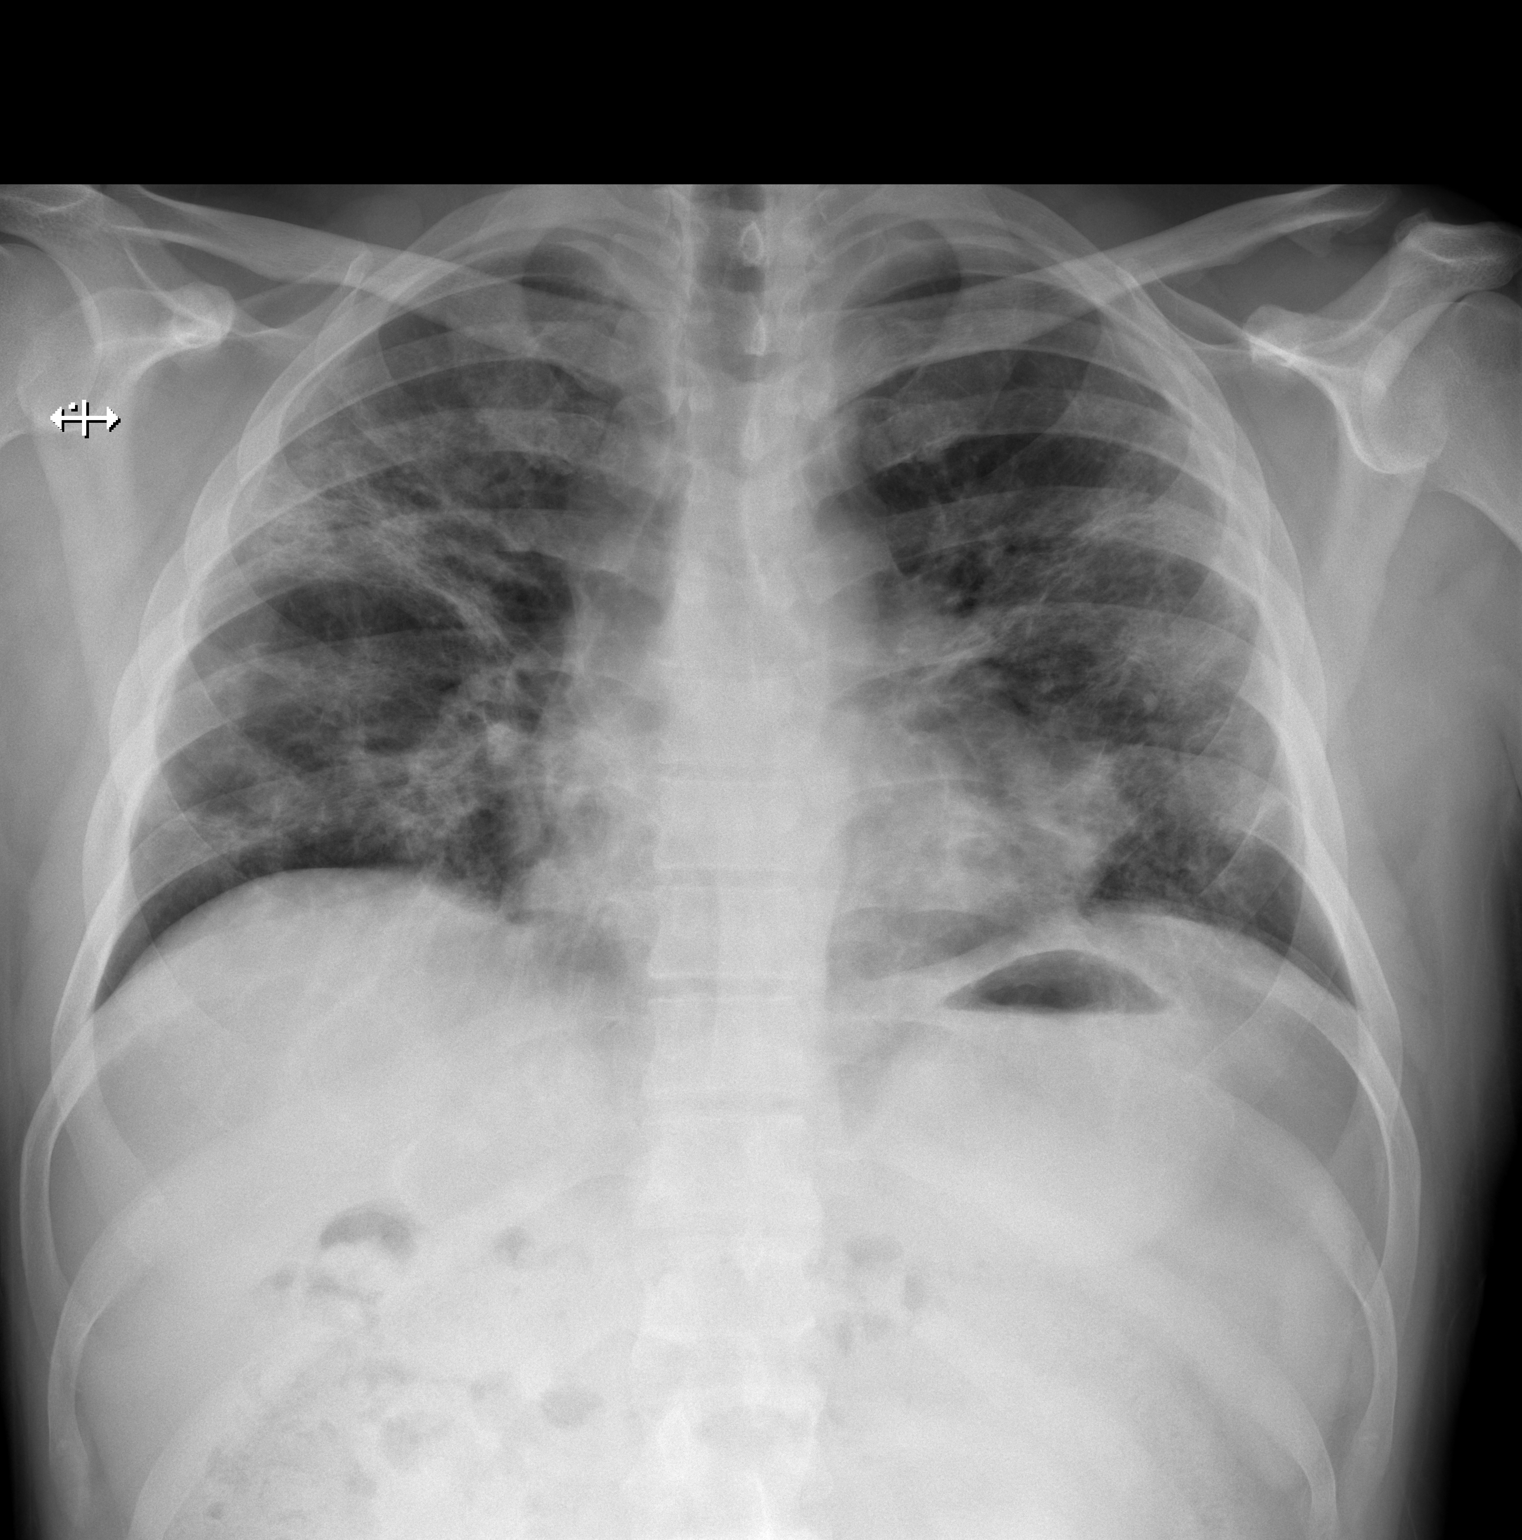

[w chest lat]
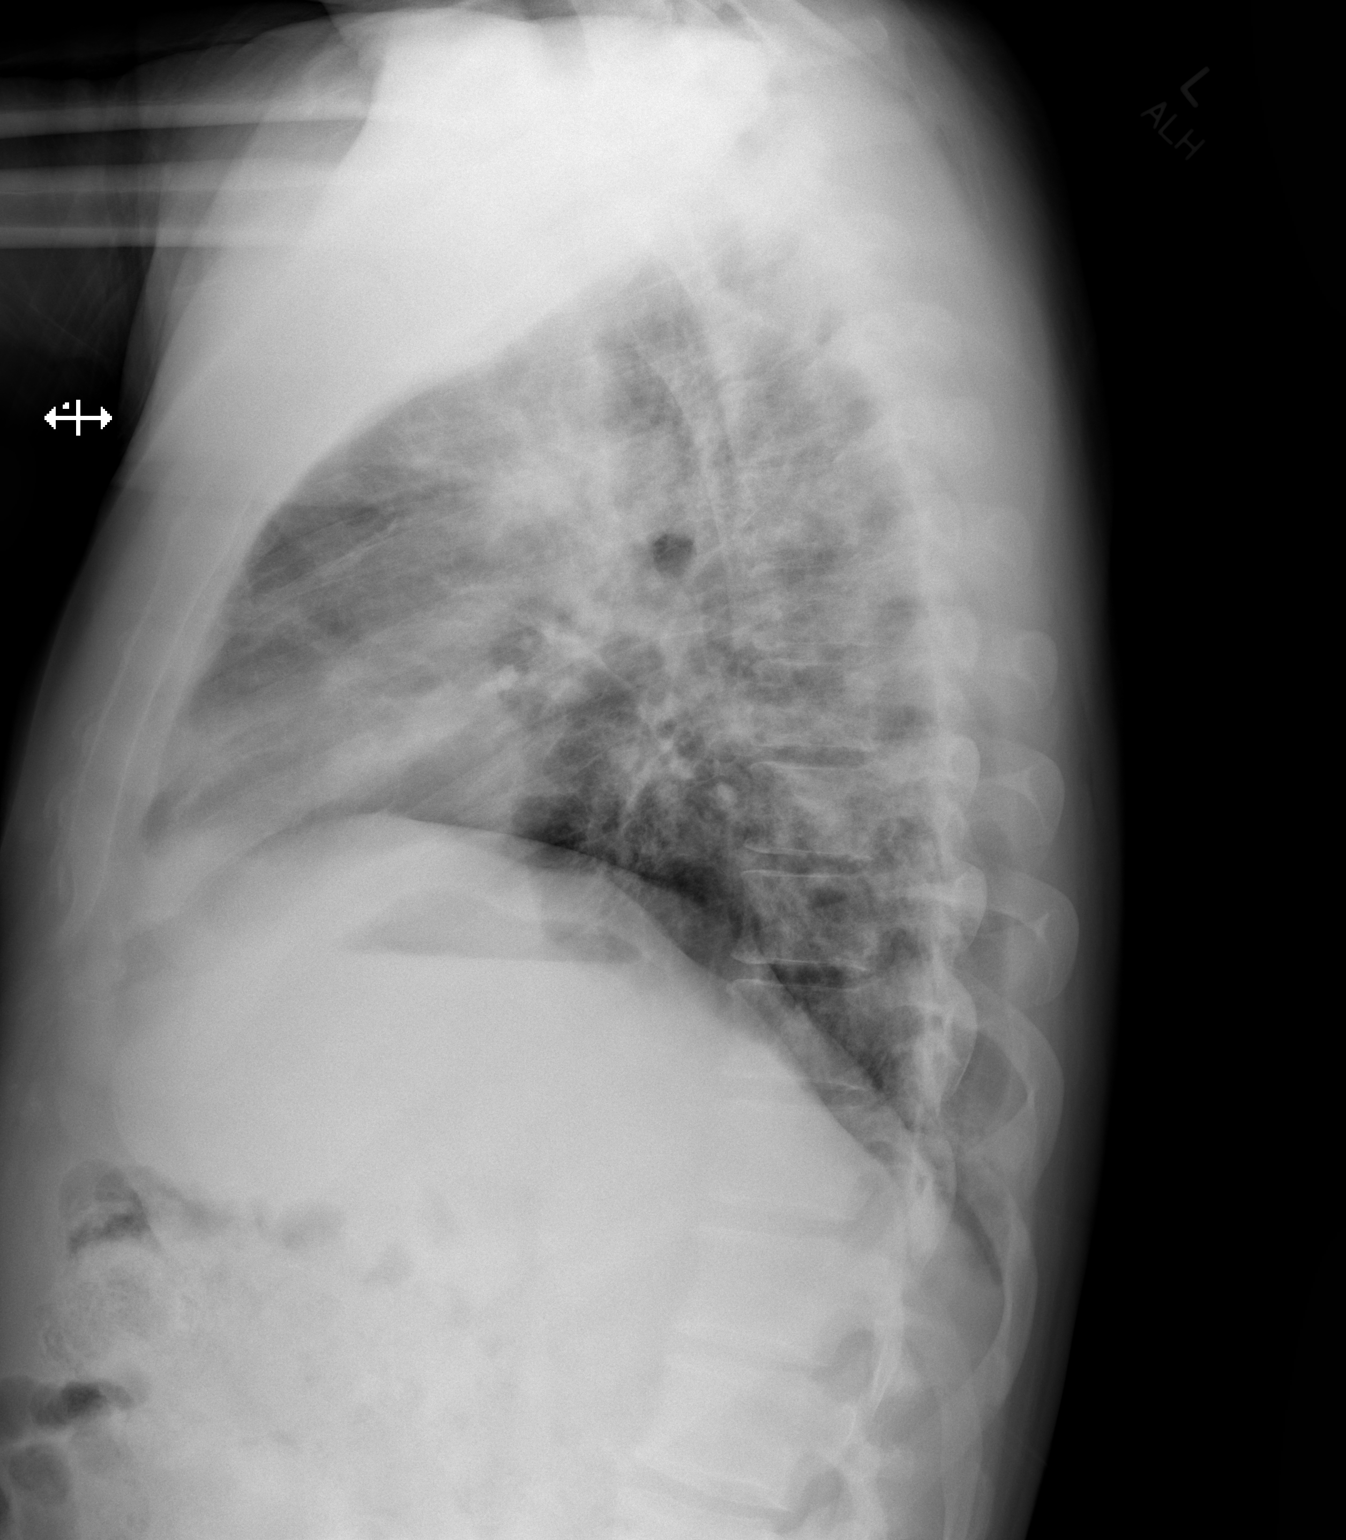

[2 of 2 positions shown; findings below may reference images not displayed]

FINDINGS: Lung volumes are small, but are symmetric. Pulmonary insufflation
has slightly diminished since prior examination. Extensive
multifocal pulmonary infiltrates are again identified and have
slightly improved in the interval since prior examination. No
pneumothorax or pleural effusion. Cardiac size within normal limits.
Pulmonary vascularity is normal. No acute bone abnormality.
IMPRESSION: Slight interval improvement in multifocal, extensive pulmonary
infiltrates, likely infectious.

Progressive pulmonary hypoinflation.

## 2021-04-19 ENCOUNTER — Other Ambulatory Visit: Payer: Self-pay

## 2021-05-02 ENCOUNTER — Other Ambulatory Visit: Payer: Self-pay

## 2021-06-20 ENCOUNTER — Other Ambulatory Visit: Payer: Self-pay

## 2021-11-14 ENCOUNTER — Telehealth: Payer: Self-pay | Admitting: Pharmacy Technician

## 2021-11-14 NOTE — Telephone Encounter (Signed)
Patient failed to provide 2022 proof of income.  No additional medication assistance will be provided by [facility] without the required proof of income documentation.  Patient notified by letter.  [name] Care Manager [facility]     [address] [place], [place]  [address]   November 14, 2021    Luis Dyer [address] [place], [place]  [address]  Dear Luis Dyer:  This is to inform you that you are no longer eligible to receive medication assistance at [facility].  The reason(s) are:    _____Your total gross monthly household income exceeds 250% of the Federal Poverty Level.   _____Tangible assets (savings, checking, stocks/bonds, pension, retirement, etc.) exceeds our             limit  _____You are eligible to receive benefits from [facility], [facility] or HIV Medication              Assistance Program _____You are eligible to receive benefits from a Medicare Part D plan _____You have prescription insurance  _____You are not an [facility] resident __X__Failure to provide all requested documentation.  Still need to provide 2021 Federal Tax Return. Medication assistance will resume once all requested documentation has been returned to our clinic.  If you have questions, please contact our clinic at [phone].    Thank you,  [facility]

## 2021-11-19 ENCOUNTER — Other Ambulatory Visit: Payer: Self-pay

## 2022-04-03 ENCOUNTER — Other Ambulatory Visit: Payer: Self-pay

## 2023-09-23 ENCOUNTER — Other Ambulatory Visit: Payer: Self-pay
